# Patient Record
Sex: Female | Born: 1975 | Race: Black or African American | Hispanic: No | Marital: Married | Smoking: Former smoker
Health system: Southern US, Community
[De-identification: ages and names within clinical notes are randomized; demographics above are authoritative.]

## PROBLEM LIST (undated history)

## (undated) DIAGNOSIS — M1990 Unspecified osteoarthritis, unspecified site: Secondary | ICD-10-CM

---

## 1997-12-21 ENCOUNTER — Other Ambulatory Visit: Admission: RE | Admit: 1997-12-21 | Discharge: 1997-12-21 | Payer: Self-pay | Admitting: Obstetrics and Gynecology

## 1998-11-27 ENCOUNTER — Emergency Department (HOSPITAL_COMMUNITY): Admission: EM | Admit: 1998-11-27 | Discharge: 1998-11-27 | Payer: Self-pay

## 1998-11-29 ENCOUNTER — Emergency Department (HOSPITAL_COMMUNITY): Admission: EM | Admit: 1998-11-29 | Discharge: 1998-11-29 | Payer: Self-pay | Admitting: Emergency Medicine

## 1998-12-13 ENCOUNTER — Other Ambulatory Visit: Admission: RE | Admit: 1998-12-13 | Discharge: 1998-12-13 | Payer: Self-pay | Admitting: *Deleted

## 1999-04-20 ENCOUNTER — Ambulatory Visit (HOSPITAL_BASED_OUTPATIENT_CLINIC_OR_DEPARTMENT_OTHER): Admission: RE | Admit: 1999-04-20 | Discharge: 1999-04-20 | Payer: Self-pay | Admitting: Surgery

## 1999-05-08 ENCOUNTER — Other Ambulatory Visit: Admission: RE | Admit: 1999-05-08 | Discharge: 1999-05-08 | Payer: Self-pay | Admitting: Gynecology

## 2000-04-15 ENCOUNTER — Other Ambulatory Visit: Admission: RE | Admit: 2000-04-15 | Discharge: 2000-04-15 | Payer: Self-pay | Admitting: Obstetrics and Gynecology

## 2000-10-31 ENCOUNTER — Ambulatory Visit (HOSPITAL_COMMUNITY): Admission: RE | Admit: 2000-10-31 | Discharge: 2000-10-31 | Payer: Self-pay | Admitting: Obstetrics and Gynecology

## 2001-07-06 ENCOUNTER — Other Ambulatory Visit: Admission: RE | Admit: 2001-07-06 | Discharge: 2001-07-06 | Payer: Self-pay | Admitting: Obstetrics and Gynecology

## 2002-01-12 ENCOUNTER — Inpatient Hospital Stay (HOSPITAL_COMMUNITY): Admission: AD | Admit: 2002-01-12 | Discharge: 2002-01-12 | Payer: Self-pay | Admitting: Obstetrics and Gynecology

## 2002-01-14 ENCOUNTER — Ambulatory Visit (HOSPITAL_COMMUNITY): Admission: RE | Admit: 2002-01-14 | Discharge: 2002-01-14 | Payer: Self-pay | Admitting: Obstetrics and Gynecology

## 2003-12-13 ENCOUNTER — Ambulatory Visit (HOSPITAL_COMMUNITY): Admission: RE | Admit: 2003-12-13 | Discharge: 2003-12-13 | Payer: Self-pay | Admitting: Obstetrics and Gynecology

## 2004-01-12 ENCOUNTER — Other Ambulatory Visit: Admission: RE | Admit: 2004-01-12 | Discharge: 2004-01-12 | Payer: Self-pay | Admitting: Obstetrics and Gynecology

## 2004-04-12 ENCOUNTER — Inpatient Hospital Stay (HOSPITAL_COMMUNITY): Admission: AD | Admit: 2004-04-12 | Discharge: 2004-04-12 | Payer: Self-pay | Admitting: Obstetrics and Gynecology

## 2005-06-17 ENCOUNTER — Other Ambulatory Visit: Admission: RE | Admit: 2005-06-17 | Discharge: 2005-06-17 | Payer: Self-pay | Admitting: Obstetrics and Gynecology

## 2005-08-20 ENCOUNTER — Inpatient Hospital Stay (HOSPITAL_COMMUNITY): Admission: EM | Admit: 2005-08-20 | Discharge: 2005-08-22 | Payer: Self-pay | Admitting: *Deleted

## 2005-09-28 IMAGING — US US OB COMP LESS 14 WK
1 series · 18 of 20 positions shown · non-contrast
Comparison: none

CLINICAL DATA: 7 weeks pregnant with cramping and abdominal pain.
 EARLY OBSTETRICAL ULTRASOUND WITH TRANSVAGINAL:
 Intrauterine gestational sac is present.  Yolk sac and embryo are visible.  By measurements of the crown rump length estimated gestational age is 8 weeks 2 days which does correspond to a gestational age by LMP which is 8 weeks 5 days.  Fetal cardiac activity is measured 167 bpm.  No subchorionic hemorrhage is seen.  Corpus luteum cyst is noted on the right ovary.  Ovaries are otherwise normal.  No free fluid is seen.

[Series 1: us ob comp<14 wk · 18 of 20 slices shown]
[im 1/20]
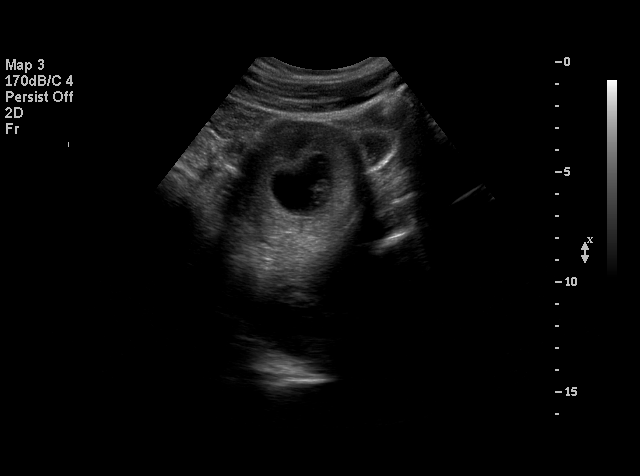
[im 2/20]
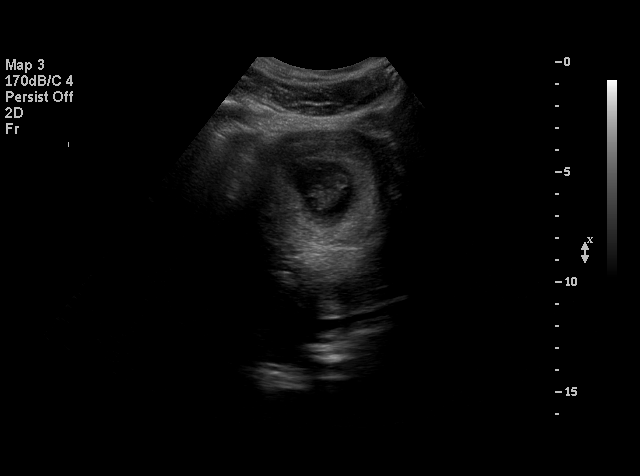
[im 3/20]
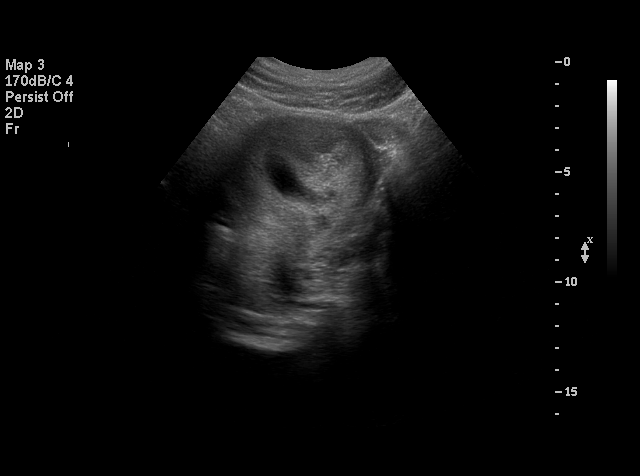
[im 4/20]
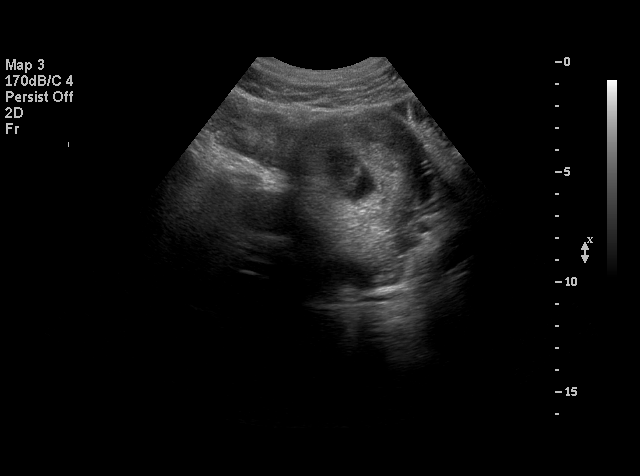
[im 6/20]
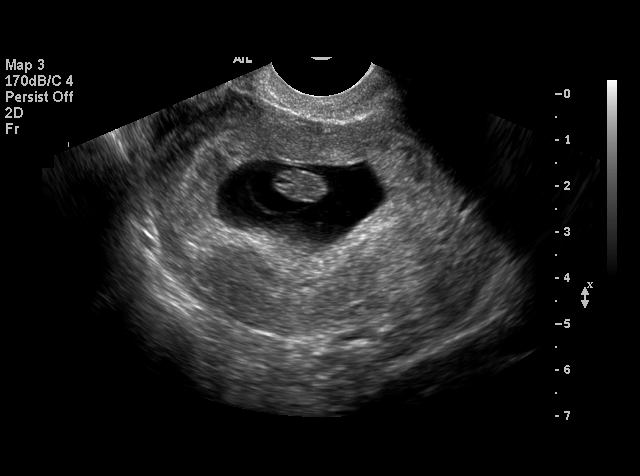
[im 7/20]
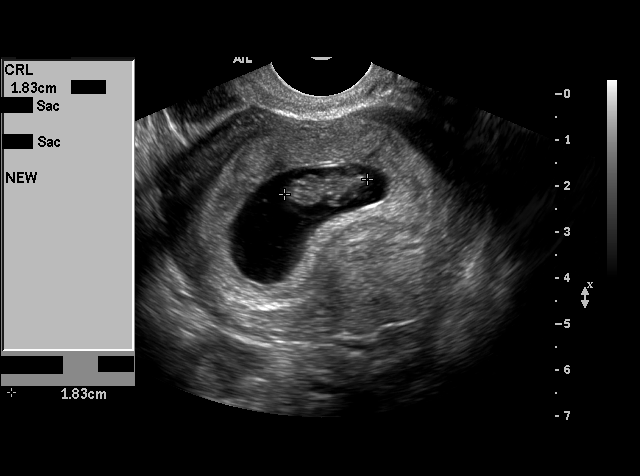
[im 8/20]
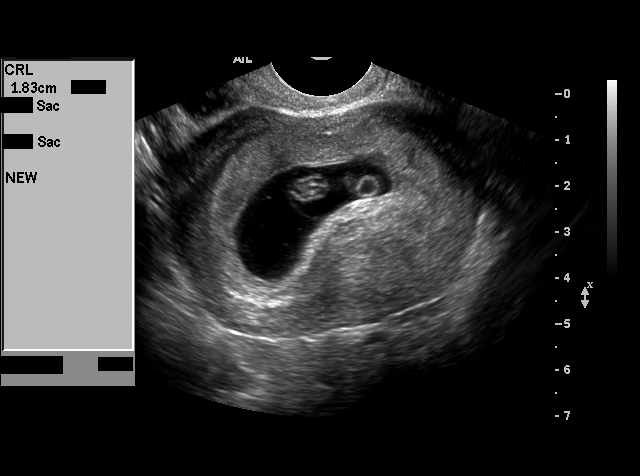
[im 9/20]
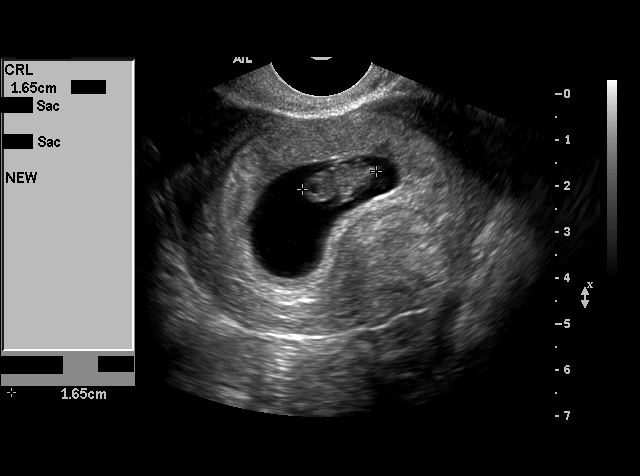
[im 10/20]
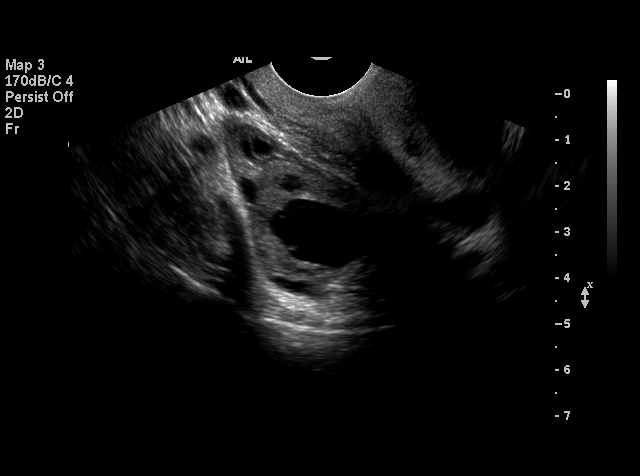
[im 11/20]
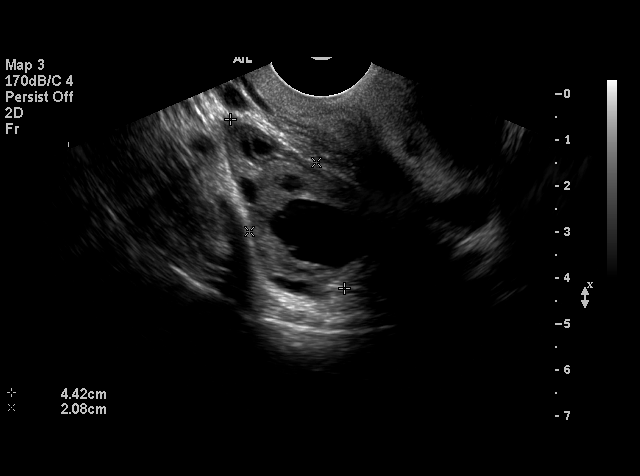
[im 12/20]
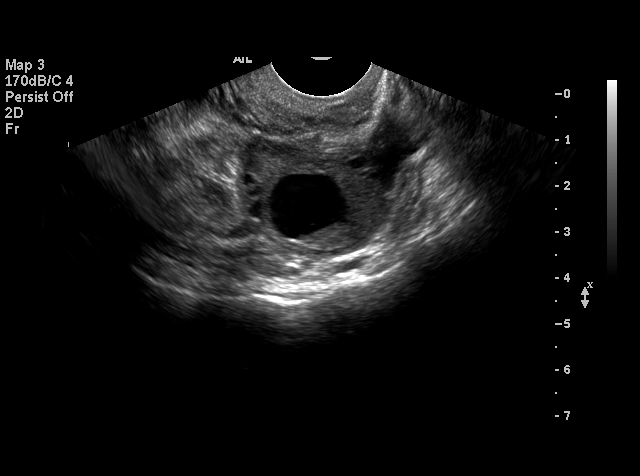
[im 13/20]
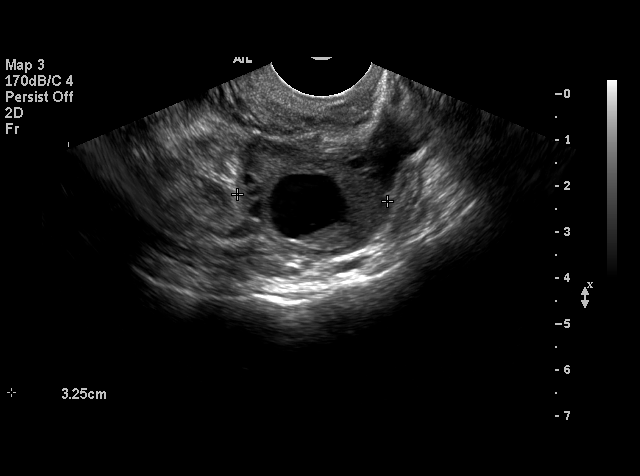
[im 14/20]
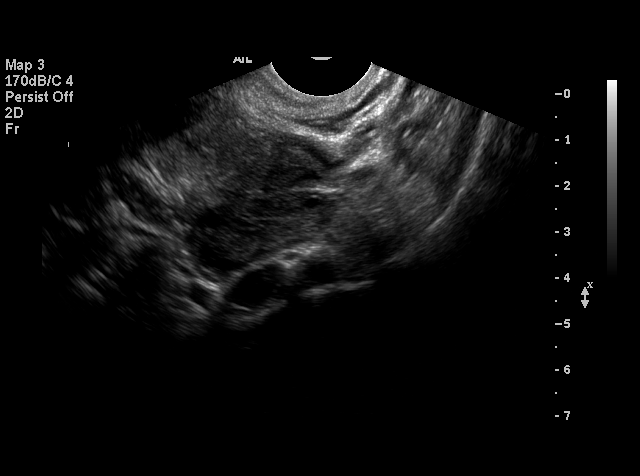
[im 16/20]
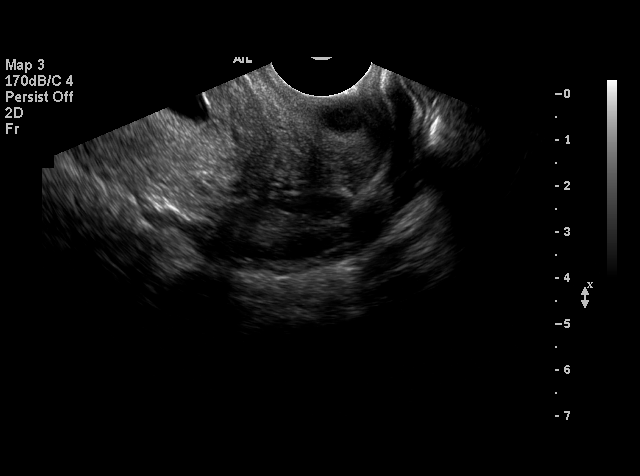
[im 17/20]
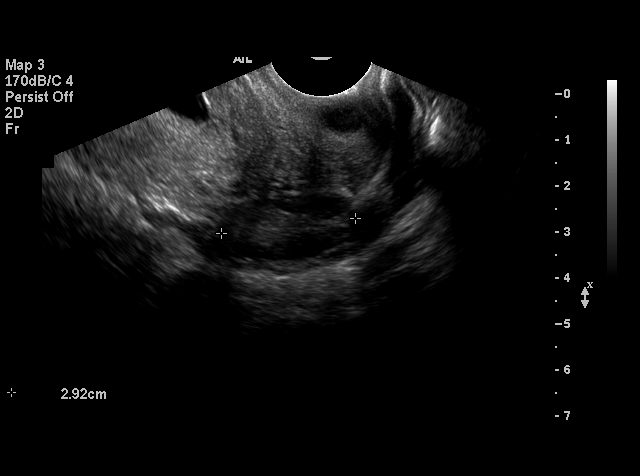
[im 18/20]
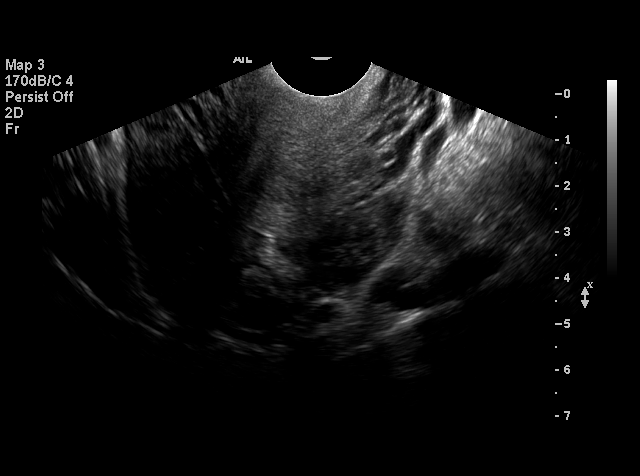
[im 19/20]
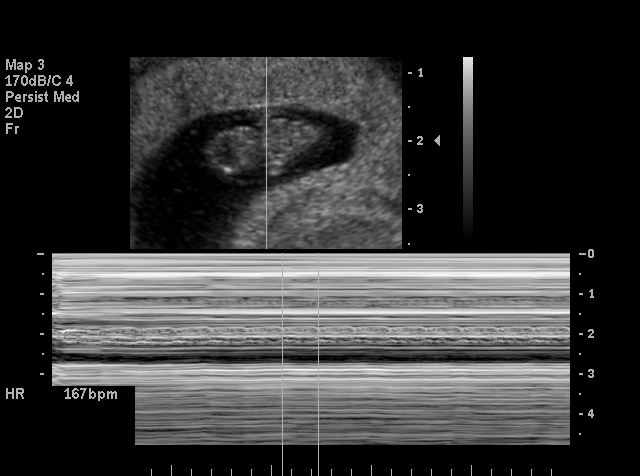
[im 20/20]
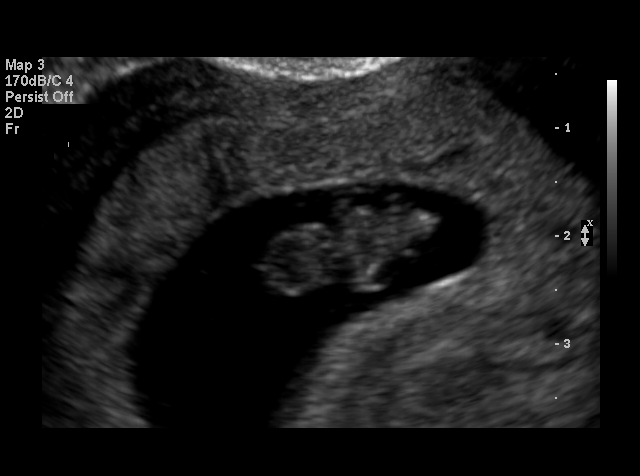

[18 of 20 positions shown; findings below may reference images not displayed]

IMPRESSION: Living intrauterine embryo of 8 weeks 2 days gestation.

## 2006-03-11 HISTORY — PX: CHOLECYSTECTOMY: SHX55

## 2011-03-12 HISTORY — PX: APPENDECTOMY: SHX54

## 2011-12-31 ENCOUNTER — Other Ambulatory Visit: Payer: Self-pay

## 2012-01-09 ENCOUNTER — Ambulatory Visit (INDEPENDENT_AMBULATORY_CARE_PROVIDER_SITE_OTHER): Payer: BC Managed Care – PPO | Admitting: Obstetrics and Gynecology

## 2012-01-09 ENCOUNTER — Encounter: Payer: Self-pay | Admitting: Obstetrics and Gynecology

## 2012-01-09 ENCOUNTER — Other Ambulatory Visit: Payer: Self-pay | Admitting: Obstetrics and Gynecology

## 2012-01-09 VITALS — BP 102/70 | Resp 14 | Ht 66.0 in | Wt 172.0 lb

## 2012-01-09 DIAGNOSIS — Z01419 Encounter for gynecological examination (general) (routine) without abnormal findings: Secondary | ICD-10-CM

## 2012-01-09 DIAGNOSIS — Z124 Encounter for screening for malignant neoplasm of cervix: Secondary | ICD-10-CM

## 2012-01-09 DIAGNOSIS — Z113 Encounter for screening for infections with a predominantly sexual mode of transmission: Secondary | ICD-10-CM

## 2012-01-09 LAB — RPR

## 2012-01-09 MED ORDER — VALACYCLOVIR HCL 500 MG PO TABS: 500.0000 mg | ORAL_TABLET | Freq: Every day | ORAL | Status: AC

## 2012-01-09 NOTE — Progress Notes (Signed)
Patient ID: AAISHA BACKS, female   DOB: 10-19-1975, 36 y.o.   MRN: [id number] Contraception none Last pap 2012 wnl Last Mammo years ago. Pt Mom is Breast cancer survivor Last Colonoscopy 2012 wnl Last Dexa Scan never Primary MD none Abuse at Home none  Pt requests refill of valtrex and referral to PCP secondary to h/o back problems.  Filed Vitals:   01/09/12 1113  BP: 102/70  Resp: 14   ROS: noncontributory  Physical Examination: General appearance - alert, well appearing, and in no distress Neck - supple, no significant adenopathy Chest - clear to auscultation, no wheezes, rales or rhonchi, symmetric air entry Heart - normal rate and regular rhythm Abdomen - soft, nontender, nondistended, no masses or organomegaly Breasts - breasts appear normal, no suspicious masses, no skin or nipple changes or axillary nodes Pelvic - normal external genitalia, vulva, vagina, cervix, uterus and adnexa Back exam - no CVAT Extremities - no edema, redness or tenderness in the calves or thighs  A/P Valtrex qd Std screen with consent Pap today Refer to Dr. Allyne Gee

## 2012-01-10 LAB — PAP IG W/ RFLX HPV ASCU

## 2012-01-10 LAB — HEPATITIS C ANTIBODY: HCV Ab: NEGATIVE

## 2012-01-10 LAB — HEPATITIS B SURFACE ANTIGEN: Hepatitis B Surface Ag: NEGATIVE

## 2012-01-10 LAB — HSV 2 ANTIBODY, IGG: HSV 2 Glycoprotein G Ab, IgG: 3.69 IV — ABNORMAL HIGH

## 2012-01-10 LAB — HSV 1 ANTIBODY, IGG: HSV 1 Glycoprotein G Ab, IgG: 8.74 IV — ABNORMAL HIGH

## 2012-01-10 LAB — HIV ANTIBODY (ROUTINE TESTING W REFLEX): HIV: NONREACTIVE

## 2012-01-15 ENCOUNTER — Telehealth: Payer: Self-pay

## 2012-01-15 NOTE — Telephone Encounter (Signed)
LM for pt to cb for test results. Oakley, Jacqueline A  

## 2012-01-15 NOTE — Telephone Encounter (Signed)
Informed pt of all test results. GC/CT still pending, as they were not run with the pap for some reason. Claire Fisher A

## 2012-01-21 LAB — GC/CHLAMYDIA PROBE AMP, TP VIAL
Chlamydia Probe Amp: NEGATIVE
GC Probe Amp: NEGATIVE

## 2012-04-25 ENCOUNTER — Other Ambulatory Visit: Payer: Self-pay

## 2013-01-14 ENCOUNTER — Other Ambulatory Visit: Payer: Self-pay

## 2013-10-08 ENCOUNTER — Ambulatory Visit (INDEPENDENT_AMBULATORY_CARE_PROVIDER_SITE_OTHER): Payer: BC Managed Care – PPO | Admitting: General Surgery

## 2013-10-19 ENCOUNTER — Ambulatory Visit (INDEPENDENT_AMBULATORY_CARE_PROVIDER_SITE_OTHER): Payer: BC Managed Care – PPO | Admitting: Surgery

## 2014-01-10 ENCOUNTER — Other Ambulatory Visit (INDEPENDENT_AMBULATORY_CARE_PROVIDER_SITE_OTHER): Payer: Self-pay | Admitting: Surgery

## 2014-01-10 NOTE — H&P (Signed)
Claire Fisher  Location: [facility] Patient #: [id number] DOB: 1975-11-23 Single / Language: Claire Fisher / Race: Black or African American Female History of Present Illness [name] MD; 01/10/2014 2:39 PM) Patient words: abdominal mass/lipoma.  The patient is a 38 year old female who presents with an abdominal mass. Patient sent to me from Dr. Everett Graff. Reason for visit: painful abdominal wall nodule. Pleasant active woman. History of laparoscopic appendectomy and cholecystectomy and ovarian cyst removal in the past 10 years. She felt a painful lump around her bellybutton after doing abdominal crunches. This was in June. It persisted. She saw her gynecologist. Surgical consultation recommended. Went to see a surgeron in [place]. He thought probably a hematoma. Recommended heat and nonsteroidals. Return if not improved. The patient notes that its never really gone away. She feels like it is more painful with activity. Feels that when she is twisting. Noticed it after curing her son at a football game. She is back to walking only as more aggressive exercise makes it worse. She thinks it gets larger with activity. Gynecology requested 2nd opinion with our group It is not associated with her menstrual cycle. She is feeling rather sensitive now. Not due to to start menses for another 2 weeks. Heat sometimes helps. Iced sometimes helps. No nausea or vomiting. Having bowel movements every day. No fevers chills or sweats. No for trauma. No history of hypertrophic scarring her keloids. No history of would infections. No history of stitch abscesses. Other Problems Marjean Donna, CMA; 01/10/2014 1:34 PM) Arthritis Back Pain Lump In Breast Migraine Headache  Past Surgical History Marjean Donna, CMA; 01/10/2014 1:34 PM) Appendectomy Breast Biopsy Bilateral. Breast Mass; Local Excision Bilateral. Gallbladder Surgery - Laparoscopic  Diagnostic Studies History Marjean Donna,  CMA; 01/10/2014 1:34 PM) Colonoscopy 1-5 years ago Mammogram >3 years ago Pap Smear 1-5 years ago  Allergies [name], CMA; 01/10/2014 1:35 PM) No Known Drug Allergies 01/10/2014  Medication History ([name], CMA; 01/10/2014 1:35 PM) ValACYclovir HCl (500MG  Tablet, Oral daily) Active. Diclofenac Sodium (75MG  Tablet DR, Oral daily) Active.  Social History ([place]; 01/10/2014 1:34 PM) Alcohol use Moderate alcohol use. Caffeine use Carbonated beverages, Tea. Tobacco use Former smoker.  Family History Marjean Donna, CMA; 01/10/2014 1:34 PM) Alcohol Abuse Father, Sister. Arthritis Mother, Sister. Breast Cancer Mother. Cerebrovascular Accident Father. Diabetes Mellitus Mother, Sister. Heart Disease Father. Hypertension Father, Mother, Sister. Kidney Disease Sister. Migraine Headache Mother. Thyroid problems Mother.  Pregnancy / Birth History Marjean Donna, [place]; 01/10/2014 1:34 PM) Age at menarche 40 years. Gravida 2 Maternal age 69-20 Regular periods     Review of Systems ([place]; 01/10/2014 1:34 PM) General Present- Appetite Loss and Weight Gain. Not Present- Chills, Fatigue, Fever, Night Sweats and Weight Loss. Skin Not Present- Change in Wart/Mole, Dryness, Hives, Jaundice, New Lesions, Non-Healing Wounds, Rash and Ulcer. HEENT Not Present- Earache, Hearing Loss, Hoarseness, Nose Bleed, Oral Ulcers, Ringing in the Ears, Seasonal Allergies, Sinus Pain, Sore Throat, Visual Disturbances, Wears glasses/contact lenses and Yellow Eyes. Respiratory Not Present- Bloody sputum, Chronic Cough, Difficulty Breathing, Snoring and Wheezing. Breast Not Present- Breast Mass, Breast Pain, Nipple Discharge and Skin Changes. Cardiovascular Not Present- Chest Pain, Difficulty Breathing Lying Down, Leg Cramps, Palpitations, Rapid Heart Rate, Shortness of Breath and Swelling of Extremities. Gastrointestinal Present- Abdominal Pain and Gets full quickly at  meals. Not Present- Bloating, Bloody Stool, Change in Bowel Habits, Chronic diarrhea, Constipation, Difficulty Swallowing, Excessive gas, Hemorrhoids, Indigestion, Nausea, Rectal Pain and Vomiting.  Female Genitourinary Not Present- Frequency, Nocturia, Painful Urination, Pelvic Pain and Urgency. Musculoskeletal Present- Back Pain. Not Present- Joint Pain, Joint Stiffness, Muscle Pain, Muscle Weakness and Swelling of Extremities. Neurological Not Present- Decreased Memory, Fainting, Headaches, Numbness, Seizures, Tingling, Tremor, Trouble walking and Weakness. Psychiatric Not Present- Anxiety, Bipolar, Change in Sleep Pattern, Depression, Fearful and Frequent crying. Endocrine Not Present- Cold Intolerance, Excessive Hunger, Hair Changes, Heat Intolerance, Hot flashes and New Diabetes. Hematology Not Present- Easy Bruising, Excessive bleeding, Gland problems, HIV and Persistent Infections.  Vitals ([name] CMA; 01/10/2014 1:36 PM) 01/10/2014 1:35 PM Weight: 170 lb Height: 67in Body Surface Area: 1.91 m Body Mass Index: 26.63 kg/m Temp.: 97.51F(Temporal)  Pulse: 73 (Regular)  BP: 122/70 (Sitting, Left Arm, Standard)     Physical Exam [name] MD; 01/10/2014 2:22 PM)  General Mental Status-Alert. General Appearance-Not in acute distress, Not Sickly. Orientation-Oriented X3. Hydration-Well hydrated. Voice-Normal.  Integumentary Global Assessment Upon inspection and palpation of skin surfaces of the - Axillae: non-tender, no inflammation or ulceration, no drainage. and Distribution of scalp and body hair is normal. General Characteristics Temperature - normal warmth is noted.  Head and Neck Head-normocephalic, atraumatic with no lesions or palpable masses. Face Global Assessment - atraumatic, no absence of expression. Neck Global Assessment - no abnormal movements, no bruit auscultated on the right, no bruit auscultated on the left, no decreased  range of motion, non-tender. Trachea-midline. Thyroid Gland Characteristics - non-tender.  Eye Eyeball - Left-Extraocular movements intact, No Nystagmus. Eyeball - Right-Extraocular movements intact, No Nystagmus. Cornea - Left-No Hazy. Cornea - Right-No Hazy. Sclera/Conjunctiva - Left-No scleral icterus, No Discharge. Sclera/Conjunctiva - Right-No scleral icterus, No Discharge. Pupil - Left-Direct reaction to light normal. Pupil - Right-Direct reaction to light normal.  ENMT Ears Pinna - Left - no drainage observed, no generalized tenderness observed. Right - no drainage observed, no generalized tenderness observed. Nose and Sinuses External Inspection of the Nose - no destructive lesion observed. Inspection of the nares - Left - quiet respiration. Right - quiet respiration. Mouth and Throat Lips - Upper Lip - no fissures observed, no pallor noted. Lower Lip - no fissures observed, no pallor noted. Nasopharynx - no discharge present. Oral Cavity/Oropharynx - Tongue - no dryness observed. Oral Mucosa - no cyanosis observed. Hypopharynx - no evidence of airway distress observed.  Chest and Lung Exam Inspection Movements - Normal and Symmetrical. Accessory muscles - No use of accessory muscles in breathing. Palpation Palpation of the chest reveals - Non-tender. Auscultation Breath sounds - Normal and Clear.  Cardiovascular Auscultation Rhythm - Regular. Murmurs & Other Heart Sounds - Auscultation of the heart reveals - No Murmurs and No Systolic Clicks.  Abdomen Inspection Inspection of the abdomen reveals - No Visible peristalsis and No Abnormal pulsations. Umbilicus - No Bleeding, No Urine drainage. Palpation/Percussion Palpation and Percussion of the abdomen reveal - Soft, Non Tender, No Rebound tenderness, No Rigidity (guarding) and No Cutaneous hyperesthesia.   Female Genitourinary Sexual Maturity Tanner 5 - Adult hair pattern. Note: No vaginal  bleeding nor discharge   Peripheral Vascular Upper Extremity Inspection - Left - No Cyanotic nailbeds, Not Ischemic. Right - No Cyanotic nailbeds, Not Ischemic.  Neurologic Neurologic evaluation reveals -normal attention span and ability to concentrate, able to name objects and repeat phrases. Appropriate fund of knowledge , normal sensation and normal coordination. Mental Status Affect - not angry, not paranoid. Cranial Nerves-Normal Bilaterally. Gait-Normal.  Neuropsychiatric Mental status exam performed with findings of-able to articulate well with normal  speech/language, rate, volume and coherence, thought content normal with ability to perform basic computations and apply abstract reasoning and no evidence of hallucinations, delusions, obsessions or homicidal/suicidal ideation.  Musculoskeletal Global Assessment Spine, Ribs and Pelvis - no instability, subluxation or laxity. Right Upper Extremity - no instability, subluxation or laxity.  Lymphatic Head & Neck  General Head & Neck Lymphatics: Bilateral - Description - No Localized lymphadenopathy. Axillary  General Axillary Region: Bilateral - Description - No Localized lymphadenopathy. Femoral & Inguinal  Generalized Femoral & Inguinal Lymphatics: Left - Description - No Localized lymphadenopathy. Right - Description - No Localized lymphadenopathy.    Assessment & Plan [name] MD; 01/10/2014 2:26 PM)  ABDOMINAL WALL MASS OF PERIUMBILICAL REGION (951.88  R19.05) Impression: Is difficult to determine what these nodules are. I would suspect endometriomas given how firm and fixed to the fascia they are, but they do not correlate with a menstrual cycle R. she had any history of uterine surgery. More likely they are incarcerated hernias.  Because there is no evidence of infection or abscess, reasonable to conthol pain better with anti-inflammatories plus ice/heat. does not seem like a hematoma. Should have  resolved by now. It has been 3 months since her consult by the other surgeon  I think she would benefit from diagnostic laparoscopy to rule out periumbilical incisional hernia. If there are hernias, would reduce and remove the contents and patch the region with mesh since she has recurrent hernias despite stitches.  If that is totally normal, then plan periumbilical abdominal wall exploration with removal of masses. Risks, benefits, alternatives discussed. Questions answered. She agrees to proceed.  Current Plans Schedule for Surgery Pt Education - [facility] Pain Control (Gross) Pt Education - [facility] Laparosopic Post Op HCI (Gross) ABDOMINAL WALL MASS OF RIGHT LOWER QUADRANT (789.33  R19.03)  [name], M.D., F.A.C.S. Gastrointestinal and Minimally Invasive Surgery [facility] [address], [place], [place] [address] [phone] Main / Paging

## 2014-02-23 NOTE — Pre-Procedure Instructions (Signed)
SCARLET ABAD  [birth year]   Your procedure is scheduled on:  Thurs, Dec 31 @ 7:30 AM  Report to [facility] at 5:30 AM.  Call this number if you have problems the morning of surgery: [phone]   Remember:   Do not eat food or drink liquids after midnight.   Take these medicines the morning of surgery with A SIP OF WATER: Valtrex(Valcyclovir)              No Goody's,BC's,Aleve,Aspirin,Ibuprofen,Fish Oil,or any Herbal Medications a week prior to surgery.    Do not wear jewelry, make-up or nail polish.  Do not wear lotions, powders, or perfumes. You may wear deodorant.  Do not shave 48 hours prior to surgery.  Do not bring valuables to the hospital.  [facility] is not responsible                  for any belongings or valuables.               Contacts, dentures or bridgework may not be worn into surgery.  Leave suitcase in the car. After surgery it may be brought to your room.  For patients admitted to the hospital, discharge time is determined by your                treatment team.               Patients discharged the day of surgery will not be allowed to drive  home.    Special Instructions:   - Preparing for Surgery  Before surgery, you can play an important role.  Because skin is not sterile, your skin needs to be as free of germs as possible.  You can reduce the number of germs on you skin by washing with CHG (chlorahexidine gluconate) soap before surgery.  CHG is an antiseptic cleaner which kills germs and bonds with the skin to continue killing germs even after washing.  Please DO NOT use if you have an allergy to CHG or antibacterial soaps.  If your skin becomes reddened/irritated stop using the CHG and inform your nurse when you arrive at Short Stay.  Do not shave (including legs and underarms) for at least 48 hours prior to the first CHG shower.  You may shave your face.  Please follow these instructions carefully:   1.  Shower with CHG Soap  the night before surgery and the                                morning of Surgery.  2.  If you choose to wash your hair, wash your hair first as usual with your       normal shampoo.  3.  After you shampoo, rinse your hair and body thoroughly to remove the                      Shampoo.  4.  Use CHG as you would any other liquid soap.  You can apply chg directly       to the skin and wash gently with scrungie or a clean washcloth.  5.  Apply the CHG Soap to your body ONLY FROM THE NECK DOWN.        Do not use on open wounds or open sores.  Avoid contact with your eyes,       ears, mouth and genitals (  private parts).  Wash genitals (private parts)       with your normal soap.  6.  Wash thoroughly, paying special attention to the area where your surgery        will be performed.  7.  Thoroughly rinse your body with warm water from the neck down.  8.  DO NOT shower/wash with your normal soap after using and rinsing off       the CHG Soap.  9.  Pat yourself dry with a clean towel.            10.  Wear clean pajamas.            11.  Place clean sheets on your bed the night of your first shower and do not        sleep with pets.  Day of Surgery  Do not apply any lotions/deoderants the morning of surgery.  Please wear clean clothes to the hospital/surgery center.     Please read over the following fact sheets that you were given: Pain Booklet, Coughing and Deep Breathing and Surgical Site Infection Prevention

## 2014-02-24 ENCOUNTER — Other Ambulatory Visit (HOSPITAL_COMMUNITY): Payer: Self-pay

## 2014-02-24 ENCOUNTER — Encounter (HOSPITAL_COMMUNITY)
Admission: RE | Admit: 2014-02-24 | Discharge: 2014-02-24 | Disposition: A | Discharge status: Home / Self Care | Payer: BC Managed Care – PPO | Source: Ambulatory Visit | Attending: Surgery | Admitting: Surgery

## 2014-02-24 ENCOUNTER — Encounter (HOSPITAL_COMMUNITY): Payer: Self-pay

## 2014-02-24 DIAGNOSIS — Z01812 Encounter for preprocedural laboratory examination: Secondary | ICD-10-CM | POA: Diagnosis not present

## 2014-02-24 HISTORY — DX: Unspecified osteoarthritis, unspecified site: M19.90

## 2014-02-24 LAB — CBC
HCT: 40.4 % (ref 36.0–46.0)
Hemoglobin: 13.2 g/dL (ref 12.0–15.0)
MCH: 27.8 pg (ref 26.0–34.0)
MCHC: 32.7 g/dL (ref 30.0–36.0)
MCV: 85.1 fL (ref 78.0–100.0)
Platelets: 230 10*3/uL (ref 150–400)
RBC: 4.75 MIL/uL (ref 3.87–5.11)
RDW: 13.9 % (ref 11.5–15.5)
WBC: 7 10*3/uL (ref 4.0–10.5)

## 2014-02-24 LAB — HCG, SERUM, QUALITATIVE: Preg, Serum: NEGATIVE

## 2014-02-24 MED ORDER — CHLORHEXIDINE GLUCONATE 4 % EX LIQD: 1.0000 "application " | Freq: Once | CUTANEOUS | Status: DC

## 2014-03-09 MED ORDER — CHLORHEXIDINE GLUCONATE 4 % EX LIQD
1.0000 "application " | Freq: Once | CUTANEOUS | Status: DC
  Filled 2014-03-09: qty 15

## 2014-03-09 MED ORDER — CEFAZOLIN SODIUM-DEXTROSE 2-3 GM-% IV SOLR
2.0000 g | INTRAVENOUS | Status: AC
  Administered 2014-03-10: 2 g via INTRAVENOUS
  Filled 2014-03-09: qty 50

## 2014-03-10 ENCOUNTER — Encounter (HOSPITAL_COMMUNITY)
Admission: RE | Disposition: A | Discharge status: Home / Self Care | Payer: Self-pay | Source: Ambulatory Visit | Attending: Surgery

## 2014-03-10 ENCOUNTER — Ambulatory Visit (HOSPITAL_COMMUNITY): Payer: BC Managed Care – PPO | Admitting: Certified Registered Nurse Anesthetist

## 2014-03-10 ENCOUNTER — Encounter (HOSPITAL_COMMUNITY): Payer: Self-pay | Admitting: *Deleted

## 2014-03-10 ENCOUNTER — Ambulatory Visit (HOSPITAL_COMMUNITY)
Admission: RE | Admit: 2014-03-10 | Discharge: 2014-03-10 | Disposition: A | Discharge status: Home / Self Care | Payer: BC Managed Care – PPO | Source: Ambulatory Visit | Attending: Surgery | Admitting: Surgery

## 2014-03-10 DIAGNOSIS — G43909 Migraine, unspecified, not intractable, without status migrainosus: Secondary | ICD-10-CM | POA: Insufficient documentation

## 2014-03-10 DIAGNOSIS — D214 Benign neoplasm of connective and other soft tissue of abdomen: Secondary | ICD-10-CM | POA: Insufficient documentation

## 2014-03-10 DIAGNOSIS — Z87891 Personal history of nicotine dependence: Secondary | ICD-10-CM | POA: Insufficient documentation

## 2014-03-10 DIAGNOSIS — R1905 Periumbilic swelling, mass or lump: Secondary | ICD-10-CM | POA: Diagnosis present

## 2014-03-10 DIAGNOSIS — M1990 Unspecified osteoarthritis, unspecified site: Secondary | ICD-10-CM | POA: Diagnosis not present

## 2014-03-10 HISTORY — PX: LAPAROSCOPY: SHX197

## 2014-03-10 SURGERY — LAPAROSCOPY, DIAGNOSTIC
Anesthesia: General | Site: Abdomen

## 2014-03-10 MED ORDER — BUPIVACAINE-EPINEPHRINE 0.25% -1:200000 IJ SOLN
INTRAMUSCULAR | Status: DC | PRN
  Administered 2014-03-10: 20 mL
  Administered 2014-03-10 (×2): 30 mL

## 2014-03-10 MED ORDER — BUPIVACAINE-EPINEPHRINE (PF) 0.25% -1:200000 IJ SOLN
INTRAMUSCULAR | Status: AC
  Filled 2014-03-10: qty 30

## 2014-03-10 MED ORDER — FENTANYL CITRATE 0.05 MG/ML IJ SOLN
25.0000 ug | INTRAMUSCULAR | Status: DC | PRN
  Administered 2014-03-10 (×2): 50 ug via INTRAVENOUS

## 2014-03-10 MED ORDER — 0.9 % SODIUM CHLORIDE (POUR BTL) OPTIME
TOPICAL | Status: DC | PRN
  Administered 2014-03-10 (×2): 1000 mL

## 2014-03-10 MED ORDER — LACTATED RINGERS IV SOLN
INTRAVENOUS | Status: DC | PRN
  Administered 2014-03-10 (×2): via INTRAVENOUS

## 2014-03-10 MED ORDER — DEXAMETHASONE SODIUM PHOSPHATE 4 MG/ML IJ SOLN
INTRAMUSCULAR | Status: AC
  Filled 2014-03-10: qty 1

## 2014-03-10 MED ORDER — KETOROLAC TROMETHAMINE 30 MG/ML IJ SOLN
INTRAMUSCULAR | Status: DC | PRN
  Administered 2014-03-10: 30 mg via INTRAVENOUS

## 2014-03-10 MED ORDER — GLYCOPYRROLATE 0.2 MG/ML IJ SOLN
INTRAMUSCULAR | Status: AC
  Filled 2014-03-10: qty 5

## 2014-03-10 MED ORDER — OXYCODONE HCL 5 MG PO TABS
ORAL_TABLET | ORAL | Status: AC
  Filled 2014-03-10: qty 1

## 2014-03-10 MED ORDER — OXYCODONE HCL 5 MG PO TABS
5.0000 mg | ORAL_TABLET | Freq: Once | ORAL | Status: AC | PRN
  Administered 2014-03-10: 5 mg via ORAL

## 2014-03-10 MED ORDER — NAPROXEN 500 MG PO TABS: 500.0000 mg | ORAL_TABLET | Freq: Two times a day (BID) | ORAL | Status: AC

## 2014-03-10 MED ORDER — ONDANSETRON HCL 4 MG/2ML IJ SOLN
INTRAMUSCULAR | Status: AC
  Filled 2014-03-10: qty 2

## 2014-03-10 MED ORDER — FENTANYL CITRATE 0.05 MG/ML IJ SOLN
INTRAMUSCULAR | Status: AC
  Filled 2014-03-10: qty 2

## 2014-03-10 MED ORDER — ARTIFICIAL TEARS OP OINT
TOPICAL_OINTMENT | OPHTHALMIC | Status: DC | PRN
  Administered 2014-03-10: 1 via OPHTHALMIC

## 2014-03-10 MED ORDER — KETOROLAC TROMETHAMINE 30 MG/ML IJ SOLN
INTRAMUSCULAR | Status: AC
  Filled 2014-03-10: qty 1

## 2014-03-10 MED ORDER — PHENOL 1.4 % MT LIQD
1.0000 | OROMUCOSAL | Status: DC | PRN
  Filled 2014-03-10: qty 177

## 2014-03-10 MED ORDER — NEOSTIGMINE METHYLSULFATE 10 MG/10ML IV SOLN
INTRAVENOUS | Status: AC
  Filled 2014-03-10: qty 1

## 2014-03-10 MED ORDER — PROPOFOL 10 MG/ML IV BOLUS
INTRAVENOUS | Status: AC
  Filled 2014-03-10: qty 20

## 2014-03-10 MED ORDER — MENTHOL 3 MG MT LOZG: 1.0000 | LOZENGE | OROMUCOSAL | Status: DC | PRN

## 2014-03-10 MED ORDER — MIDAZOLAM HCL 2 MG/2ML IJ SOLN
INTRAMUSCULAR | Status: AC
  Filled 2014-03-10: qty 2

## 2014-03-10 MED ORDER — DEXAMETHASONE SODIUM PHOSPHATE 4 MG/ML IJ SOLN
INTRAMUSCULAR | Status: DC | PRN
  Administered 2014-03-10: 4 mg via INTRAVENOUS

## 2014-03-10 MED ORDER — ROCURONIUM BROMIDE 50 MG/5ML IV SOLN
INTRAVENOUS | Status: AC
  Filled 2014-03-10: qty 1

## 2014-03-10 MED ORDER — ONDANSETRON HCL 4 MG/2ML IJ SOLN
INTRAMUSCULAR | Status: DC | PRN
  Administered 2014-03-10: 4 mg via INTRAVENOUS

## 2014-03-10 MED ORDER — GLYCOPYRROLATE 0.2 MG/ML IJ SOLN
INTRAMUSCULAR | Status: DC | PRN
  Administered 2014-03-10: .9 mg via INTRAVENOUS

## 2014-03-10 MED ORDER — LIDOCAINE HCL (CARDIAC) 20 MG/ML IV SOLN
INTRAVENOUS | Status: DC | PRN
  Administered 2014-03-10: 40 mg via INTRAVENOUS

## 2014-03-10 MED ORDER — MENTHOL 3 MG MT LOZG
LOZENGE | OROMUCOSAL | Status: AC
  Filled 2014-03-10: qty 9

## 2014-03-10 MED ORDER — LIDOCAINE HCL (CARDIAC) 20 MG/ML IV SOLN
INTRAVENOUS | Status: AC
  Filled 2014-03-10: qty 5

## 2014-03-10 MED ORDER — MIDAZOLAM HCL 5 MG/5ML IJ SOLN
INTRAMUSCULAR | Status: DC | PRN
  Administered 2014-03-10: 2 mg via INTRAVENOUS

## 2014-03-10 MED ORDER — OXYCODONE HCL 5 MG PO TABS
5.0000 mg | ORAL_TABLET | ORAL | Status: AC | PRN
Start: 2014-03-10 — End: ?

## 2014-03-10 MED ORDER — NEOSTIGMINE METHYLSULFATE 10 MG/10ML IV SOLN
INTRAVENOUS | Status: DC | PRN
  Administered 2014-03-10: 5 mg via INTRAVENOUS

## 2014-03-10 MED ORDER — PROPOFOL 10 MG/ML IV BOLUS
INTRAVENOUS | Status: DC | PRN
  Administered 2014-03-10: 150 mg via INTRAVENOUS

## 2014-03-10 MED ORDER — FENTANYL CITRATE 0.05 MG/ML IJ SOLN
INTRAMUSCULAR | Status: AC
  Filled 2014-03-10: qty 5

## 2014-03-10 MED ORDER — OXYCODONE HCL 5 MG/5ML PO SOLN: 5.0000 mg | Freq: Once | ORAL | Status: AC | PRN

## 2014-03-10 MED ORDER — OXYCODONE HCL 5 MG PO TABS
ORAL_TABLET | ORAL | Status: AC
  Administered 2014-03-10: 5 mg
  Filled 2014-03-10: qty 1

## 2014-03-10 MED ORDER — ROCURONIUM BROMIDE 100 MG/10ML IV SOLN
INTRAVENOUS | Status: DC | PRN
  Administered 2014-03-10: 40 mg via INTRAVENOUS

## 2014-03-10 MED ORDER — ONDANSETRON HCL 4 MG/2ML IJ SOLN: 4.0000 mg | Freq: Once | INTRAMUSCULAR | Status: DC | PRN

## 2014-03-10 MED ORDER — FENTANYL CITRATE 0.05 MG/ML IJ SOLN
INTRAMUSCULAR | Status: DC | PRN
  Administered 2014-03-10: 100 ug via INTRAVENOUS
  Administered 2014-03-10 (×3): 50 ug via INTRAVENOUS

## 2014-03-10 MED ORDER — BUPIVACAINE 0.25 % ON-Q PUMP DUAL CATH 300 ML
300.0000 mL | INJECTION | Status: DC
  Filled 2014-03-10: qty 300

## 2014-03-10 SURGICAL SUPPLY — 51 items
APPLICATOR COTTON TIP 6IN STRL (MISCELLANEOUS) IMPLANT
APPLIER CLIP LOGIC TI 5 (MISCELLANEOUS) IMPLANT
APPLIER CLIP ROT 10 11.4 M/L (STAPLE)
APR CLP MED LRG 11.4X10 (STAPLE)
APR CLP MED LRG 33X5 (MISCELLANEOUS)
CANISTER SUCTION 2500CC (MISCELLANEOUS) IMPLANT
CHLORAPREP W/TINT 26ML (MISCELLANEOUS) ×3 IMPLANT
CLIP APPLIE ROT 10 11.4 M/L (STAPLE) IMPLANT
COVER SURGICAL LIGHT HANDLE (MISCELLANEOUS) ×3 IMPLANT
DEVICE SECURE STRAP 25 ABSORB (INSTRUMENTS) IMPLANT
DRAPE LAPAROSCOPIC ABDOMINAL (DRAPES) ×3 IMPLANT
DRAPE WARM FLUID 44X44 (DRAPE) ×3 IMPLANT
DRSG TEGADERM 2-3/8X2-3/4 SM (GAUZE/BANDAGES/DRESSINGS) ×4 IMPLANT
DRSG TEGADERM 4X4.75 (GAUZE/BANDAGES/DRESSINGS) ×2 IMPLANT
ELECT CAUTERY BLADE 6.4 (BLADE) ×2 IMPLANT
ELECT REM PT RETURN 9FT ADLT (ELECTROSURGICAL) ×3
ELECTRODE REM PT RTRN 9FT ADLT (ELECTROSURGICAL) ×2 IMPLANT
GAUZE SPONGE 2X2 8PLY STRL LF (GAUZE/BANDAGES/DRESSINGS) ×2 IMPLANT
GLOVE BIOGEL PI IND STRL 7.0 (GLOVE) ×2 IMPLANT
GLOVE BIOGEL PI IND STRL 8 (GLOVE) ×3 IMPLANT
GLOVE BIOGEL PI INDICATOR 7.0 (GLOVE) ×2
GLOVE BIOGEL PI INDICATOR 8 (GLOVE) ×2
GLOVE ECLIPSE 8.0 STRL XLNG CF (GLOVE) ×3 IMPLANT
GLOVE SURG SS PI 7.0 STRL IVOR (GLOVE) ×4 IMPLANT
GOWN STRL REUS W/ TWL LRG LVL3 (GOWN DISPOSABLE) ×4 IMPLANT
GOWN STRL REUS W/ TWL XL LVL3 (GOWN DISPOSABLE) ×2 IMPLANT
GOWN STRL REUS W/TWL LRG LVL3 (GOWN DISPOSABLE) ×6
GOWN STRL REUS W/TWL XL LVL3 (GOWN DISPOSABLE) ×3
KIT BASIN OR (CUSTOM PROCEDURE TRAY) ×3 IMPLANT
KIT ROOM TURNOVER OR (KITS) ×3 IMPLANT
NDL SPNL 22GX3.5 QUINCKE BK (NEEDLE) ×1 IMPLANT
NEEDLE 22X1 1/2 (OR ONLY) (NEEDLE) ×3 IMPLANT
NEEDLE SPNL 22GX3.5 QUINCKE BK (NEEDLE) ×3 IMPLANT
NS IRRIG 1000ML POUR BTL (IV SOLUTION) ×6 IMPLANT
PAD ARMBOARD 7.5X6 YLW CONV (MISCELLANEOUS) ×6 IMPLANT
PEN SKIN MARKING BROAD (MISCELLANEOUS) ×3 IMPLANT
PENCIL BUTTON HOLSTER BLD 10FT (ELECTRODE) ×2 IMPLANT
SCALPEL HARMONIC ACE (MISCELLANEOUS) IMPLANT
SCISSORS LAP 5X35 DISP (ENDOMECHANICALS) IMPLANT
SLEEVE ENDOPATH XCEL 5M (ENDOMECHANICALS) ×6 IMPLANT
SPONGE GAUZE 2X2 STER 10/PKG (GAUZE/BANDAGES/DRESSINGS) ×1
STRIP CLOSURE SKIN 1/2X4 (GAUZE/BANDAGES/DRESSINGS) ×3 IMPLANT
SUT MNCRL AB 4-0 PS2 18 (SUTURE) ×5 IMPLANT
SUT PDS AB 1 CT  36 (SUTURE) ×2
SUT PDS AB 1 CT 36 (SUTURE) ×2 IMPLANT
SUT PROLENE 1 CT (SUTURE) ×4 IMPLANT
TOWEL OR 17X24 6PK STRL BLUE (TOWEL DISPOSABLE) ×3 IMPLANT
TOWEL OR 17X26 10 PK STRL BLUE (TOWEL DISPOSABLE) ×3 IMPLANT
TRAY LAPAROSCOPIC (CUSTOM PROCEDURE TRAY) ×3 IMPLANT
TROCAR XCEL NON-BLD 5MMX100MML (ENDOMECHANICALS) ×3 IMPLANT
TUBING INSUFFLATION (TUBING) ×3 IMPLANT

## 2014-03-10 NOTE — Transfer of Care (Signed)
Immediate Anesthesia Transfer of Care Note  Patient: Claire Fisher  Procedure(s) Performed: Procedure(s): LAPAROSCOPY DIAGNOSTIC and EXCISION ABDOMINAL MASS X 2 (N/A)  Patient Location: PACU  Anesthesia Type:General  Level of Consciousness: awake, alert  and oriented  Airway & Oxygen Therapy: Patient Spontanous Breathing and Patient connected to nasal cannula oxygen  Post-op Assessment: Report given to PACU RN and Post -op Vital signs reviewed and stable  Post vital signs: Reviewed and stable  Complications: No apparent anesthesia complications

## 2014-03-10 NOTE — Interval H&P Note (Signed)
History and Physical Interval Note:  13/10/6576 4:69 AM  Lunette Stands  has presented today for surgery, with the diagnosis of Periumbilical Abdominal Wall Masses  The various methods of treatment have been discussed with the patient and family. After consideration of risks, benefits and other options for treatment, the patient has consented to  Procedure(s): LAPAROSCOPY DIAGNOSTIC, POSSIBLE ABDOMINAL WALL EXPLORATION (N/A) POSSIBLE LAPAROSCOPIC VENTRAL HERNIA (N/A) as a surgical intervention .  The patient's history has been reviewed, patient examined, no change in status, stable for surgery.  I have reviewed the patient's chart and labs.  Questions were answered to the patient's satisfaction.     GROSS,STEVEN C.

## 2014-03-10 NOTE — Anesthesia Procedure Notes (Signed)
Procedure Name: Intubation Date/Time: 03/10/2014 7:47 AM Performed by: Ollen Bowl Pre-anesthesia Checklist: Patient identified, Emergency Drugs available, Suction available, Patient being monitored and Timeout performed Patient Re-evaluated:Patient Re-evaluated prior to inductionOxygen Delivery Method: Circle system utilized and Simple face mask Preoxygenation: Pre-oxygenation with 100% oxygen Intubation Type: Combination inhalational/ intravenous induction Ventilation: Mask ventilation without difficulty Laryngoscope Size: Miller and 2 Grade View: Grade I Tube type: Oral Tube size: 7.5 mm Number of attempts: 1 Airway Equipment and Method: Patient positioned with wedge pillow and Stylet Placement Confirmation: ETT inserted through vocal cords under direct vision,  positive ETCO2 and breath sounds checked- equal and bilateral Secured at: 21 cm Tube secured with: Tape Dental Injury: Teeth and Oropharynx as per pre-operative assessment

## 2014-03-10 NOTE — Anesthesia Preprocedure Evaluation (Addendum)
Anesthesia Evaluation  Patient identified by MRN, date of birth, ID band Patient awake    Reviewed: Allergy & Precautions, H&P , NPO status , Patient's Chart, lab work & pertinent test results  Airway Mallampati: II  TM Distance: >3 FB Neck ROM: Full    Dental  (+) Teeth Intact, Dental Advisory Given   Pulmonary former smoker,  breath sounds clear to auscultation        Cardiovascular Rhythm:Regular Rate:Normal     Neuro/Psych    GI/Hepatic   Endo/Other    Renal/GU      Musculoskeletal  (+) Arthritis -,   Abdominal   Peds  Hematology   Anesthesia Other Findings   Reproductive/Obstetrics                            Anesthesia Physical Anesthesia Plan  ASA: II  Anesthesia Plan: General   Post-op Pain Management:    Induction: Intravenous  Airway Management Planned: Oral ETT  Additional Equipment:   Intra-op Plan:   Post-operative Plan: Extubation in OR  Informed Consent: I have reviewed the patients History and Physical, chart, labs and discussed the procedure including the risks, benefits and alternatives for the proposed anesthesia with the patient or authorized representative who has indicated his/her understanding and acceptance.   Dental advisory given  Plan Discussed with: CRNA and Anesthesiologist  Anesthesia Plan Comments:        Anesthesia Quick Evaluation

## 2014-03-10 NOTE — Op Note (Signed)
03/10/2014  9:55 AM  PATIENT:  Claire Fisher  38 y.o. female  Patient Care Team: Pollyann Samples, MD as PCP - General (Family Medicine)  PRE-OPERATIVE DIAGNOSIS:  Periumbilical Abdominal Wall Masses  POST-OPERATIVE DIAGNOSIS:    Periumbilical Abdominal Wall Mass Infraumbilical stitch fistula  PROCEDURE:  Procedure(s): LAPAROSCOPY DIAGNOSTIC EXCISION ABDOMINAL MASS X 2  SURGEON:  Surgeon(s): Michael Boston, MD  ASSISTANT: RN   ANESTHESIA:   local and general  EBL:  Total I/O In: 1400 [I.V.:1400] Out: -   Delay start of Pharmacological VTE agent (>24hrs) due to surgical blood loss or risk of bleeding:  no  DRAINS: none   SPECIMEN:  Source of Specimen:  1.  RLQ SQ ABDOMINAL WALL MASS  2. 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  INDICATION: Pleasant female with painful nodules infraumbilical and right lower quadrant.  No history of endometriosis.  Prior laparoscopic cholecystectomy.  Some question of intermittent drainage at the umbilicus one point.  I recommended diagnostic laparoscopy versus abdominal wall exploration for possible incarcerated hernia versus subcutaneous nodules.  The anatomy & physiology of the abdominal wall was discussed.  The pathophysiology of hernias was discussed.  Natural history risks without surgery including progeressive enlargement, pain, incarceration, & strangulation was discussed.   Contributors to complications such as smoking, obesity, diabetes, prior surgery, etc were discussed.   The pathophysiology of skin & subcutaneous masses was discussed.  Natural history risks without surgery were discussed.  I recommended surgery to remove the mass.  I explained the technique of removal with use of local anesthesia & possible need for more aggressive  sedation/anesthesia for patient comfort.    I feel the risks of no intervention will lead to serious problems that outweigh the operative risks; therefore, I recommended surgery to reduce and repair the hernia.  I explained laparoscopic techniques with possible need for an open approach.  I noted the probable use of mesh to patch and/or buttress the hernia repair  Risks such as bleeding, infection, abscess, need for further treatment, stroke, heart attack, death, and other risks were discussed.  I noted a good likelihood this will help address the problem.   Goals of post-operative recovery were discussed as well.  Possibility that this will not correct all symptoms was explained.  I stressed the importance of low-impact activity, aggressive pain control, avoiding constipation, & not pushing through pain to minimize risk of post-operative chronic pain or injury. Possibility of reherniation especially with smoking, obesity, diabetes, immunosuppression, and other health conditions was discussed.  We will work to minimize complications.     An educational handout further explaining the pathology & treatment options was given as well.  Questions were answered.  The patient expresses understanding & wishes to proceed with surgery.  OR FINDINGS: 2x1cm SQ nodule RLQ abdominal wall.  Patient had small sinus tract through infraumbilical incision.  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  Scarring to and through the fascia into the preperitoneal fat.  All excised.  Fascia closed primarily with absorbable suture  DESCRIPTION:   Informed consent was confirmed.  The patient underwent general anaesthesia without difficulty.  The patient was positioned appropriately.  VTE prevention in place.  The patient's abdomen was clipped, prepped, & draped in a sterile fashion.  Surgical timeout confirmed our plan.  The patient was  positioned in reverse Trendelenburg.  Abdominal entry with a 32mm laparoscopic  port was gained using optical entry technique in the left upper abdomen.  Entry was clean.  I induced carbon dioxide insufflation.  Camera inspection revealed no injury.  Extra ports were carefully placed under direct laparoscopic visualization.  Inspection revealed no hernias.  No adhesions the anterior abdominal wall.  No inguinal hernias.  No diastases.  With her asleep the infra umbilical and right lower quadrant paramedian nodules could be felt through the skin.  Nothing in the peritoneal side.  I therefore stopped laparoscopy.  I made a elliptical transverse incision over the right lower core subcutaneous mass excised in.  I excised a nodular mass down to the fascia but did not have to transect the fascia.  2 x 1 x 1 cm region.  I made a curvilinear incision around the transverse infraumbilical scar.  I excised this fibrous scarred tract and encountered a sinus tract around a green stitch consistent with an Ethibond stitch.  And up having transect around the fascia into the preperitoneum fat until I had much softer tissue.  Resulted in a 25 x 25 mm fascial defect.  Because the patient is healthy, not obese, not a smoker, and the defect was less than 3 cm; I decided to primarily repair this. I repaired it using #1 interrupted Prolene stitches transversely.  I wish to avoid any permanent suture at this time given the probable stitch abscess/sinus.  I did diagnostic laparoscopy and confirmed no injury to intra-abdominal organs.  Closure intact.  I evacuated carbon dioxide.  I removed ports.  I closed all skin areas with 4-0 absorbable absorbable stitch. Sterile dressing was applied. Patient is going to recovery room in stable condition. I anticipate her to leave later today.  I discussed postoperative care with the patient in my office. I discussed in the holding area. Instructions are written. I updated the status of the patient to the patient's family.  I made recommendations.  I answered questions.   Understanding & appreciation was expressed.  [name], M.D., F.A.C.S. Gastrointestinal and Minimally Invasive Surgery [facility] [address], [place], [place] [address] [phone] Main / Paging

## 2014-03-10 NOTE — Discharge Instructions (Signed)
HERNIA REPAIR: POST OP INSTRUCTIONS ° °1. DIET: Follow a light bland diet the first 24 hours after arrival home, such as soup, liquids, crackers, etc.  Be sure to include lots of fluids daily.  Avoid fast food or heavy meals as your are more likely to get nauseated.  Eat a low fat the next few days after surgery. °2. Take your usually prescribed home medications unless otherwise directed. °3. PAIN CONTROL: °a. Pain is best controlled by a usual combination of three different methods TOGETHER: °i. Ice/Heat °ii. Over the counter pain medication °iii. Prescription pain medication °b. Most patients will experience some swelling and bruising around the hernia(s) such as the bellybutton, groins, or old incisions.  Ice packs or heating pads (30-60 minutes up to 6 times a day) will help. Use ice for the first few days to help decrease swelling and bruising, then switch to heat to help relax tight/sore spots and speed recovery.  Some people prefer to use ice alone, heat alone, alternating between ice & heat.  Experiment to what works for you.  Swelling and bruising can take several weeks to resolve.   °c. It is helpful to take an over-the-counter pain medication regularly for the first few weeks.  Choose one of the following that works best for you: °i. Naproxen (Aleve, etc)  Two 220mg tabs twice a day °ii. Ibuprofen (Advil, etc) Three 200mg tabs four times a day (every meal & bedtime) °iii. Acetaminophen (Tylenol, etc) 325-650mg four times a day (every meal & bedtime) °d. A  prescription for pain medication should be given to you upon discharge.  Take your pain medication as prescribed.  °i. If you are having problems/concerns with the prescription medicine (does not control pain, nausea, vomiting, rash, itching, etc), please call us [phone] to see if we need to switch you to a different pain medicine that will work better for you and/or control your side effect better. °ii. If you need a refill on your pain  medication, please contact your pharmacy.  They will contact our office to request authorization. Prescriptions will not be filled after 5 pm or on week-ends. °4. Avoid getting constipated.  Between the surgery and the pain medications, it is common to experience some constipation.  Increasing fluid intake and taking a fiber supplement (such as Metamucil, Citrucel, FiberCon, MiraLax, etc) 1-2 times a day regularly will usually help prevent this problem from occurring.  A mild laxative (prune juice, Milk of Magnesia, MiraLax, etc) should be taken according to package directions if there are no bowel movements after 48 hours.   °5. Wash / shower every day.  You may shower over the dressings as they are waterproof.   °6. Remove your waterproof bandages 5 days after surgery.  You may leave the incision open to air.  You may replace a dressing/Band-Aid to cover the incision for comfort if you wish.  Continue to shower over incision(s) after the dressing is off. ° ° ° °7. ACTIVITIES as tolerated:   °a. You may resume regular (light) daily activities beginning the next day--such as daily self-care, walking, climbing stairs--gradually increasing activities as tolerated.  If you can walk 30 minutes without difficulty, it is safe to try more intense activity such as jogging, treadmill, bicycling, low-impact aerobics, swimming, etc. °b. Save the most intensive and strenuous activity for last such as sit-ups, heavy lifting, contact sports, etc  Refrain from any heavy lifting or straining until you are off narcotics for pain control.   °  c. DO NOT PUSH THROUGH PAIN.  Let pain be your guide: If it hurts to do something, don't do it.  Pain is your body warning you to avoid that activity for another week until the pain goes down. d. You may drive when you are no longer taking prescription pain medication, you can comfortably wear a seatbelt, and you can safely maneuver your car and apply brakes. e. Dennis Bast may have sexual intercourse  when it is comfortable.  8. FOLLOW UP in our office a. Please call [facility] at [phone] to set up an appointment to see your surgeon in the office for a follow-up appointment approximately 2-3 weeks after your surgery. b. Make sure that you call for this appointment the day you arrive home to insure a convenient appointment time. 9.  IF YOU HAVE DISABILITY OR FAMILY LEAVE FORMS, BRING THEM TO THE OFFICE FOR PROCESSING.  DO NOT GIVE THEM TO YOUR DOCTOR.  WHEN TO CALL us [phone]: 1. Poor pain control 2. Reactions / problems with new medications (rash/itching, nausea, etc)  3. Fever over 101.5 F (38.5 C) 4. Inability to urinate 5. Nausea and/or vomiting 6. Worsening swelling or bruising 7. Continued bleeding from incision. 8. Increased pain, redness, or drainage from the incision   The clinic staff is available to answer your questions during regular business hours (8:30am-5pm).  Please dont hesitate to call and ask to speak to one of our nurses for clinical concerns.   If you have a medical emergency, go to the nearest emergency room or call 911.  A surgeon from [facility] is always on call at the hospitals in [facility], [place], [place], [place], [place]  [address] ?  [address] [address], [place], [place]   [address] MAIN: [phone] ? TOLL FREE: [phone] ? FAX: [phone] [email or web address]  Managing Pain  Pain after surgery or related to activity is often due to strain/injury to muscle, tendon, nerves and/or incisions.  This pain is usually short-term and will improve in a few months.   Many people find it helpful to do the following things TOGETHER to help speed the process of healing and to get back to regular activity more quickly:  1. Avoid heavy physical activity a.  no lifting greater than 20 pounds b. Do not push through the pain.  Listen to your body and avoid positions and maneuvers than  reproduce the pain c. Walking is okay as tolerated, but go slowly and stop when getting sore.  d. Remember: If it hurts to do it, then dont do it! 2. Take Anti-inflammatory medication  a. Take with food/snack around the clock for 1-2 weeks i. This helps the muscle and nerve tissues become less irritable and calm down faster b. Choose ONE of the following over-the-counter medications: i. Naproxen 252m tabs (ex. Aleve) 1-2 pills twice a day  ii. Ibuprofen 2072mtabs (ex. Advil, Motrin) 3-4 pills with every meal and just before bedtime iii. Acetaminophen 50062mabs (Tylenol) 1-2 pills with every meal and just before bedtime 3. Use a Heating pad or Ice/Cold Pack a. 4-6 times a day b. May use warm bath/hottub  or showers 4. Try Gentle Massage and/or Stretching  a. at the area of pain many times a day b. stop if you feel pain - do not overdo it  Try these steps together to help you body heal faster and avoid making things get worse.  Doing just one of these  things may not be enough.    If you are not getting better after two weeks or are noticing you are getting worse, contact our office for further advice; we may need to re-evaluate you & see what other things we can do to help.  GETTING TO GOOD BOWEL HEALTH. Irregular bowel habits such as constipation and diarrhea can lead to many problems over time.  Having one soft bowel movement a day is the most important way to prevent further problems.  The anorectal canal is designed to handle stretching and feces to safely manage our ability to get rid of solid waste (feces, poop, stool) out of our body.  BUT, hard constipated stools can act like ripping concrete bricks and diarrhea can be a burning fire to this very sensitive area of our body, causing inflamed hemorrhoids, anal fissures, increasing risk is perirectal abscesses, abdominal pain/bloating, an making irritable bowel worse.     The goal: ONE SOFT BOWEL MOVEMENT A DAY!  To have soft, regular  bowel movements:   Drink at least 8 tall glasses of water a day.    Take plenty of fiber.  Fiber is the undigested part of plant food that passes into the colon, acting s natures broom to encourage bowel motility and movement.  Fiber can absorb and hold large amounts of water. This results in a larger, bulkier stool, which is soft and easier to pass. Work gradually over several weeks up to 6 servings a day of fiber (25g a day even more if needed) in the form of: o Vegetables -- Root (potatoes, carrots, turnips), leafy green (lettuce, salad greens, celery, spinach), or cooked high residue (cabbage, broccoli, etc) o Fruit -- Fresh (unpeeled skin & pulp), Dried (prunes, apricots, cherries, etc ),  or stewed ( applesauce)  o Whole grain breads, pasta, etc (whole wheat)  o Bran cereals   Bulking Agents -- This type of water-retaining fiber generally is easily obtained each day by one of the following:  o Psyllium bran -- The psyllium plant is remarkable because its ground seeds can retain so much water. This product is available as Metamucil, Konsyl, Effersyllium, Per Diem Fiber, or the less expensive generic preparation in drug and health food stores. Although labeled a laxative, it really is not a laxative.  o Methylcellulose -- This is another fiber derived from wood which also retains water. It is available as Citrucel. o Polyethylene Glycol - and artificial fiber commonly called Miralax or Glycolax.  It is helpful for people with gassy or bloated feelings with regular fiber o Flax Seed - a less gassy fiber than psyllium  No reading or other relaxing activity while on the toilet. If bowel movements take longer than 5 minutes, you are too constipated  AVOID CONSTIPATION.  High fiber and water intake usually takes care of this.  Sometimes a laxative is needed to stimulate more frequent bowel movements, but   Laxatives are not a good long-term solution as it can wear the colon out. o Osmotics  (Milk of Magnesia, Fleets phosphosoda, Magnesium citrate, MiraLax, GoLytely) are safer than  o Stimulants (Senokot, Castor Oil, Dulcolax, Ex Lax)    o Do not take laxatives for more than 7days in a row.   IF SEVERELY CONSTIPATED, try a Bowel Retraining Program: o Do not use laxatives.  o Eat a diet high in roughage, such as bran cereals and leafy vegetables.  o Drink six (6) ounces of prune or apricot juice each morning.  o Eat  two (2) large servings of stewed fruit each day.  o Take one (1) heaping tablespoon of a psyllium-based bulking agent twice a day. Use sugar-free sweetener when possible to avoid excessive calories.  o Eat a normal breakfast.  o Set aside 15 minutes after breakfast to sit on the toilet, but do not strain to have a bowel movement.  o If you do not have a bowel movement by the third day, use an enema and repeat the above steps.   Controlling diarrhea o Switch to liquids and simpler foods for a few days to avoid stressing your intestines further. o Avoid dairy products (especially milk & ice cream) for a short time.  The intestines often can lose the ability to digest lactose when stressed. o Avoid foods that cause gassiness or bloating.  Typical foods include beans and other legumes, cabbage, broccoli, and dairy foods.  Every person has some sensitivity to other foods, so listen to our body and avoid those foods that trigger problems for you. o Adding fiber (Citrucel, Metamucil, psyllium, Miralax) gradually can help thicken stools by absorbing excess fluid and retrain the intestines to act more normally.  Slowly increase the dose over a few weeks.  Too much fiber too soon can backfire and cause cramping & bloating. o Probiotics (such as active yogurt, Align, etc) may help repopulate the intestines and colon with normal bacteria and calm down a sensitive digestive tract.  Most studies show it to be of mild help, though, and such products can be costly. o Medicines: - Bismuth  subsalicylate (ex. Kayopectate, Pepto Bismol) every 30 minutes for up to 6 doses can help control diarrhea.  Avoid if pregnant. - Loperamide (Immodium) can slow down diarrhea.  Start with two tablets (4mg  total) first and then try one tablet every 6 hours.  Avoid if you are having fevers or severe pain.  If you are not better or start feeling worse, stop all medicines and call your doctor for advice o Call your doctor if you are getting worse or not better.  Sometimes further testing (cultures, endoscopy, X-ray studies, bloodwork, etc) may be needed to help diagnose and treat the cause of the diarrhea.   General Anesthesia, Adult, Care After  Refer to this sheet in the next few weeks. These instructions provide you with information on caring for yourself after your procedure. Your health care provider may also give you more specific instructions. Your treatment has been planned according to current medical practices, but problems sometimes occur. Call your health care provider if you have any problems or questions after your procedure.  WHAT TO EXPECT AFTER THE PROCEDURE  After the procedure, it is typical to experience:  Sleepiness.  Nausea and vomiting. HOME CARE INSTRUCTIONS  For the first 24 hours after general anesthesia:  Have a responsible person with you.  Do not drive a car. If you are alone, do not take public transportation.  Do not drink alcohol.  Do not take medicine that has not been prescribed by your health care provider.  Do not sign important papers or make important decisions.  You may resume a normal diet and activities as directed by your health care provider.  Change bandages (dressings) as directed.  If you have questions or problems that seem related to general anesthesia, call the hospital and ask for the anesthetist or anesthesiologist on call. SEEK MEDICAL CARE IF:  You have nausea and vomiting that continue the day after anesthesia.  You develop a rash. SEEK  IMMEDIATE MEDICAL CARE IF:  You have difficulty breathing.  You have chest pain.  You have any allergic problems. Document Released: 06/03/2000 Document Revised: 10/28/2012 Document Reviewed: 09/10/2012  [facility] Patient Information 2014 [place], [place].

## 2014-03-10 NOTE — H&P (Signed)
Claire Fisher  Location: [facility] Patient #: [id number] DOB: December 02, 1975 Single / Language: Cleophus Fisher / Race: Black or African American Female  History of Present Illness [name] MD; 01/10/2014 2:39 PM) Patient words: abdominal mass/lipoma.  The patient is a 38 year old female who presents with an abdominal mass. Patient sent to me from Dr. Everett Graff. Reason for visit: painful abdominal wall nodule. Pleasant active woman. History of laparoscopic appendectomy and cholecystectomy and ovarian cyst removal in the past 10 years. She felt a painful lump around her bellybutton after doing abdominal crunches. This was in June. It persisted. She saw her gynecologist. Surgical consultation recommended. Went to see a surgeron in [place]. He thought probably a hematoma. Recommended heat and nonsteroidals. Return if not improved. The patient notes that its never really gone away. She feels like it is more painful with activity. Feels that when she is twisting. Noticed it after curing her son at a football game. She is back to walking only as more aggressive exercise makes it worse. She thinks it gets larger with activity. Gynecology requested 2nd opinion with our group It is not associated with her menstrual cycle. She is feeling rather sensitive now. Not due to to start menses for another 2 weeks. Heat sometimes helps. Iced sometimes helps. No nausea or vomiting. Having bowel movements every day. No fevers chills or sweats. No for trauma. No history of hypertrophic scarring her keloids. No history of would infections. No history of stitch abscesses.   Other Problems Marjean Donna, CMA; 01/10/2014 1:34 PM) Arthritis Back Pain Lump In Breast Migraine Headache  Past Surgical History Marjean Donna, CMA; 01/10/2014 1:34 PM) Appendectomy Breast Biopsy Bilateral. Breast Mass; Local Excision Bilateral. Gallbladder Surgery - Laparoscopic  Diagnostic Studies History Marjean Donna, CMA; 01/10/2014 1:34 PM) Colonoscopy 1-5 years ago Mammogram >3 years ago Pap Smear 1-5 years ago  Allergies [name], CMA; 01/10/2014 1:35 PM) No Known Drug Allergies11/04/2013  Medication History ([name], CMA; 01/10/2014 1:35 PM) ValACYclovir HCl (500MG  Tablet, Oral daily) Active. Diclofenac Sodium (75MG  Tablet DR, Oral daily) Active.  Social History ([place]; 01/10/2014 1:34 PM) Alcohol use Moderate alcohol use. Caffeine use Carbonated beverages, Tea. Tobacco use Former smoker.  Family History Marjean Donna, CMA; 01/10/2014 1:34 PM) Alcohol Abuse Father, Sister. Arthritis Mother, Sister. Breast Cancer Mother. Cerebrovascular Accident Father. Diabetes Mellitus Mother, Sister. Heart Disease Father. Hypertension Father, Mother, Sister. Kidney Disease Sister. Migraine Headache Mother. Thyroid problems Mother.  Pregnancy / Birth History Marjean Donna, [place]; 01/10/2014 1:34 PM) Age at menarche 18 years. Gravida 2 Maternal age 24-20 Regular periods  Review of Systems ([place]; 01/10/2014 1:34 PM) General Present- Appetite Loss and Weight Gain. Not Present- Chills, Fatigue, Fever, Night Sweats and Weight Loss. Skin Not Present- Change in Wart/Mole, Dryness, Hives, Jaundice, New Lesions, Non-Healing Wounds, Rash and Ulcer. HEENT Not Present- Earache, Hearing Loss, Hoarseness, Nose Bleed, Oral Ulcers, Ringing in the Ears, Seasonal Allergies, Sinus Pain, Sore Throat, Visual Disturbances, Wears glasses/contact lenses and Yellow Eyes. Respiratory Not Present- Bloody sputum, Chronic Cough, Difficulty Breathing, Snoring and Wheezing. Breast Not Present- Breast Mass, Breast Pain, Nipple Discharge and Skin Changes. Cardiovascular Not Present- Chest Pain, Difficulty Breathing Lying Down, Leg Cramps, Palpitations, Rapid Heart Rate, Shortness of Breath and Swelling of Extremities. Gastrointestinal Present- Abdominal Pain and Gets full quickly at  meals. Not Present- Bloating, Bloody Stool, Change in Bowel Habits, Chronic diarrhea, Constipation, Difficulty Swallowing, Excessive gas, Hemorrhoids, Indigestion, Nausea, Rectal Pain and Vomiting. Female  Genitourinary Not Present- Frequency, Nocturia, Painful Urination, Pelvic Pain and Urgency. Musculoskeletal Present- Back Pain. Not Present- Joint Pain, Joint Stiffness, Muscle Pain, Muscle Weakness and Swelling of Extremities. Neurological Not Present- Decreased Memory, Fainting, Headaches, Numbness, Seizures, Tingling, Tremor, Trouble walking and Weakness. Psychiatric Not Present- Anxiety, Bipolar, Change in Sleep Pattern, Depression, Fearful and Frequent crying. Endocrine Not Present- Cold Intolerance, Excessive Hunger, Hair Changes, Heat Intolerance, Hot flashes and New Diabetes. Hematology Not Present- Easy Bruising, Excessive bleeding, Gland problems, HIV and Persistent Infections.   Vitals ([name] CMA; 01/10/2014 1:36 PM) 01/10/2014 1:35 PM Weight: 170 lb Height: 67in Body Surface Area: 1.91 m Body Mass Index: 26.63 kg/m Temp.: 97.62F(Temporal)  Pulse: 73 (Regular)  BP: 122/70 (Sitting, Left Arm, Standard)    Physical Exam [name] MD; 01/10/2014 2:22 PM) General Mental Status-Alert. General Appearance-Not in acute distress, Not Sickly. Orientation-Oriented X3. Hydration-Well hydrated. Voice-Normal.  Integumentary Global Assessment Upon inspection and palpation of skin surfaces of the - Axillae: non-tender, no inflammation or ulceration, no drainage. and Distribution of scalp and body hair is normal. General Characteristics Temperature - normal warmth is noted.  Head and Neck Head-normocephalic, atraumatic with no lesions or palpable masses. Face Global Assessment - atraumatic, no absence of expression. Neck Global Assessment - no abnormal movements, no bruit auscultated on the right, no bruit auscultated on the left, no decreased  range of motion, non-tender. Trachea-midline. Thyroid Gland Characteristics - non-tender.  Eye Eyeball - Left-Extraocular movements intact, No Nystagmus. Eyeball - Right-Extraocular movements intact, No Nystagmus. Cornea - Left-No Hazy. Cornea - Right-No Hazy. Sclera/Conjunctiva - Left-No scleral icterus, No Discharge. Sclera/Conjunctiva - Right-No scleral icterus, No Discharge. Pupil - Left-Direct reaction to light normal. Pupil - Right-Direct reaction to light normal.  ENMT Ears Pinna - Left - no drainage observed, no generalized tenderness observed. Right - no drainage observed, no generalized tenderness observed. Nose and Sinuses External Inspection of the Nose - no destructive lesion observed. Inspection of the nares - Left - quiet respiration. Right - quiet respiration. Mouth and Throat Lips - Upper Lip - no fissures observed, no pallor noted. Lower Lip - no fissures observed, no pallor noted. Nasopharynx - no discharge present. Oral Cavity/Oropharynx - Tongue - no dryness observed. Oral Mucosa - no cyanosis observed. Hypopharynx - no evidence of airway distress observed.  Chest and Lung Exam Inspection Movements - Normal and Symmetrical. Accessory muscles - No use of accessory muscles in breathing. Palpation Palpation of the chest reveals - Non-tender. Auscultation Breath sounds - Normal and Clear.  Cardiovascular Auscultation Rhythm - Regular. Murmurs & Other Heart Sounds - Auscultation of the heart reveals - No Murmurs and No Systolic Clicks.  Abdomen Inspection Inspection of the abdomen reveals - No Visible peristalsis and No Abnormal pulsations. Umbilicus - No Bleeding, No Urine drainage. Palpation/Percussion Palpation and Percussion of the abdomen reveal - Soft, Non Tender, No Rebound tenderness, No Rigidity (guarding) and No Cutaneous hyperesthesia.   Female Genitourinary Sexual Maturity Tanner 5 - Adult hair pattern. Note: No vaginal  bleeding nor discharge   Peripheral Vascular Upper Extremity Inspection - Left - No Cyanotic nailbeds, Not Ischemic. Right - No Cyanotic nailbeds, Not Ischemic.  Neurologic Neurologic evaluation reveals -normal attention span and ability to concentrate, able to name objects and repeat phrases. Appropriate fund of knowledge , normal sensation and normal coordination. Mental Status Affect - not angry, not paranoid. Cranial Nerves-Normal Bilaterally. Gait-Normal.  Neuropsychiatric Mental status exam performed with findings of-able to articulate well with normal speech/language, rate,  volume and coherence, thought content normal with ability to perform basic computations and apply abstract reasoning and no evidence of hallucinations, delusions, obsessions or homicidal/suicidal ideation.  Musculoskeletal Global Assessment Spine, Ribs and Pelvis - no instability, subluxation or laxity. Right Upper Extremity - no instability, subluxation or laxity.  Lymphatic Head & Neck  General Head & Neck Lymphatics: Bilateral - Description - No Localized lymphadenopathy. Axillary  General Axillary Region: Bilateral - Description - No Localized lymphadenopathy. Femoral & Inguinal  Generalized Femoral & Inguinal Lymphatics: Left - Description - No Localized lymphadenopathy. Right - Description - No Localized lymphadenopathy.    Assessment & Plan [name] MD; 01/10/2014 2:26 PM) ABDOMINAL WALL MASS OF PERIUMBILICAL REGION (834.19  R19.05)  Impression: Is difficult to determine what these nodules are. I would suspect endometriomas given how firm and fixed to the fascia they are, but they do not correlate with a menstrual cycle nor has she had any history of uterine surgery. More likely they are incarcerated hernias.  Because there is no evidence of infection or abscess, reasonable to conthol pain better with anti-inflammatories plus ice/heat. does not seem like a hematoma. Should have  resolved by now. It has been 3 months since her consult by the other surgeon  I think she would benefit from diagnostic laparoscopy to rule out periumbilical incisional hernia. If there are hernias, would reduce and remove the contents and patch the region with mesh since she has recurrent hernias despite stitches.  If that is totally normal, then plan periumbilical abdominal wall exploration with removal of masses. Risks, benefits, alternatives discussed. Questions answered. She agrees to proceed. Current Plans  Schedule for Surgery Pt Education - [facility] Pain Control (Gross) Pt Education - [facility] Laparosopic Post Op HCI (Gross) ABDOMINAL WALL MASS OF RIGHT LOWER QUADRANT (789.33  R19.03)    [name], M.D., F.A.C.S. Gastrointestinal and Minimally Invasive Surgery [facility] [address], [place], [place] [address] [phone] Main / Paging

## 2014-03-14 ENCOUNTER — Encounter (HOSPITAL_COMMUNITY): Payer: Self-pay | Admitting: Surgery

## 2014-03-18 NOTE — Anesthesia Postprocedure Evaluation (Signed)
  Anesthesia Post-op Note  Patient: Claire Fisher  Procedure(s) Performed: Procedure(s): LAPAROSCOPY DIAGNOSTIC and EXCISION ABDOMINAL MASS X 2 (N/A)  Patient Location: PACU  Anesthesia Type:General  Level of Consciousness: awake, alert  and oriented  Airway and Oxygen Therapy: Patient Spontanous Breathing and Patient connected to nasal cannula oxygen  Post-op Pain: mild  Post-op Assessment: Post-op Vital signs reviewed, Patient's Cardiovascular Status Stable, Respiratory Function Stable and Patent Airway  Post-op Vital Signs: stable  Last Vitals:  Filed Vitals:   03/10/14 1050  BP: 120/87  Pulse: 85  Temp:   Resp: 15    Complications: No apparent anesthesia complications

## 2019-12-24 ENCOUNTER — Other Ambulatory Visit: Payer: Self-pay | Admitting: Obstetrics and Gynecology

## 2019-12-24 DIAGNOSIS — E049 Nontoxic goiter, unspecified: Secondary | ICD-10-CM

## 2020-01-04 ENCOUNTER — Ambulatory Visit
Admission: RE | Admit: 2020-01-04 | Discharge: 2020-01-04 | Disposition: A | Discharge status: Home / Self Care | Payer: BC Managed Care – PPO | Source: Ambulatory Visit | Attending: Obstetrics and Gynecology | Admitting: Obstetrics and Gynecology

## 2020-01-04 DIAGNOSIS — E049 Nontoxic goiter, unspecified: Secondary | ICD-10-CM

## 2020-04-03 ENCOUNTER — Ambulatory Visit: Payer: BC Managed Care – PPO | Admitting: Endocrinology

## 2020-05-08 ENCOUNTER — Encounter: Payer: Self-pay | Admitting: Endocrinology

## 2020-05-08 ENCOUNTER — Other Ambulatory Visit: Payer: Self-pay

## 2020-05-08 ENCOUNTER — Ambulatory Visit (INDEPENDENT_AMBULATORY_CARE_PROVIDER_SITE_OTHER): Payer: BC Managed Care – PPO | Admitting: Endocrinology

## 2020-05-08 DIAGNOSIS — E041 Nontoxic single thyroid nodule: Secondary | ICD-10-CM | POA: Diagnosis not present

## 2020-05-08 NOTE — Progress Notes (Signed)
Subjective:    Patient ID: Claire Fisher, female    DOB: [birth year], 45 y.o.   MRN: [id number]  HPI Pt is referred by Dr Mancel Bale, for nodular thyroid.  Pt was noted to have a thyroid nodule in 2021.  she is unaware of ever having had thyroid problems in the past.  she has no h/o XRT or surgery to the neck.   Past Medical History:  Diagnosis Date  . Arthritis    lower back    Past Surgical History:  Procedure Laterality Date  . APPENDECTOMY  2013  . CHOLECYSTECTOMY  2008  . LAPAROSCOPY N/A 03/10/2014   Procedure: LAPAROSCOPY DIAGNOSTIC and EXCISION ABDOMINAL MASS X 2;  Surgeon: Michael Boston, MD;  Location: [facility];  Service: General;  Laterality: N/A;    Social History   Socioeconomic History  . Marital status: Significant Other    Spouse name: Not on file  . Number of children: Not on file  . Years of education: Not on file  . Highest education level: Not on file  Occupational History  . Not on file  Tobacco Use  . Smoking status: Former Smoker    Packs/day: 1.00    Years: 20.00    Pack years: 20.00    Types: Cigarettes  . Smokeless tobacco: Never Used  Substance and Sexual Activity  . Alcohol use: Yes    Comment: social  . Drug use: No  . Sexual activity: Not on file  Other Topics Concern  . Not on file  Social History Narrative  . Not on file   Social Determinants of Health   Financial Resource Strain: Not on file  Food Insecurity: Not on file  Transportation Needs: Not on file  Physical Activity: Not on file  Stress: Not on file  Social Connections: Not on file  Intimate Partner Violence: Not on file    Current Outpatient Medications on File Prior to Visit  Medication Sig Dispense Refill  . diclofenac (VOLTAREN) 75 MG EC tablet Take 75 mg by mouth daily as needed.  3  . naproxen (NAPROSYN) 500 MG tablet Take 1 tablet (500 mg total) by mouth 2 (two) times daily with a meal. 40 tablet 1  . oxyCODONE (OXY IR/ROXICODONE) 5 MG immediate release tablet  Take 1-2 tablets (5-10 mg total) by mouth every 4 (four) hours as needed for moderate pain, severe pain or breakthrough pain. 40 tablet 0  . valACYclovir (VALTREX) 500 MG tablet Take 1 tablet (500 mg total) by mouth daily. 30 tablet 11   No current facility-administered medications on file prior to visit.    No Known Allergies  Family History  Problem Relation Age of Onset  . Thyroid disease Mother     BP 136/90 (BP Location: Right Arm, Patient Position: Sitting, Cuff Size: Normal)   Pulse 82   Ht 5\' 7"  (1.702 m)   Wt 172 lb (78 kg)   SpO2 99%   BMI 26.94 kg/m   Review of Systems Denies hoarseness, neck pain, sob, diarrhea, and flushing.    Objective:   Physical Exam VITAL SIGNS:  See vs page GENERAL: no distress NECK: There is no palpable thyroid enlargement.  The thyroid nodule is not palpable.  No palpable lymphadenopathy at the anterior neck.   [place]: 1.6 cm thyroid nodule in the right inferior thyroid gland for which fine-needle aspiration is recommended.  TSH=1.2  I have reviewed outside records, and summarized: Pt was seen for wellness visit, and thyroid nodule was  noted.  She was then referred here.      Assessment & Plan:  Thyroid nodule, new to me, uncertain etiology and prognosis.  We discussed.  She declines f/u here.    Patient Instructions  Let's rcheck the biopsy, guided by the ultrasound.  you will receive a phone call, about a day and time for an appointment.   If no cancer is found, you should have the ultrasound rechecked each year for a total of 5 years.  Dr Mancel Bale would be happy to do that for you.

## 2020-05-08 NOTE — Patient Instructions (Signed)
Let's rcheck the biopsy, guided by the ultrasound.  you will receive a phone call, about a day and time for an appointment.   If no cancer is found, you should have the ultrasound rechecked each year for a total of 5 years.  Dr Mancel Bale would be happy to do that for you.

## 2020-05-19 ENCOUNTER — Encounter: Payer: Self-pay | Admitting: Endocrinology

## 2020-06-20 ENCOUNTER — Ambulatory Visit
Admission: RE | Admit: 2020-06-20 | Discharge: 2020-06-20 | Disposition: A | Discharge status: Home / Self Care | Payer: BC Managed Care – PPO | Source: Ambulatory Visit | Attending: Endocrinology | Admitting: Endocrinology

## 2020-06-20 ENCOUNTER — Other Ambulatory Visit (HOSPITAL_COMMUNITY)
Admission: RE | Admit: 2020-06-20 | Discharge: 2020-06-20 | Disposition: A | Discharge status: Home / Self Care | Payer: BC Managed Care – PPO | Source: Ambulatory Visit | Attending: Endocrinology | Admitting: Endocrinology

## 2020-06-20 DIAGNOSIS — E041 Nontoxic single thyroid nodule: Secondary | ICD-10-CM | POA: Diagnosis not present

## 2020-06-21 LAB — CYTOLOGY - NON PAP

## 2021-06-21 IMAGING — US US THYROID
1 series · 13 of 25 positions shown · non-contrast
Comparison: None.

CLINICAL DATA: Goiter

EXAM:
THYROID ULTRASOUND
TECHNIQUE: Ultrasound examination of the thyroid gland and adjacent soft
tissues was performed.

[Series 1: us thyroid · 0.05mm/px · 13 of 43 slices shown]
[im 1/43]
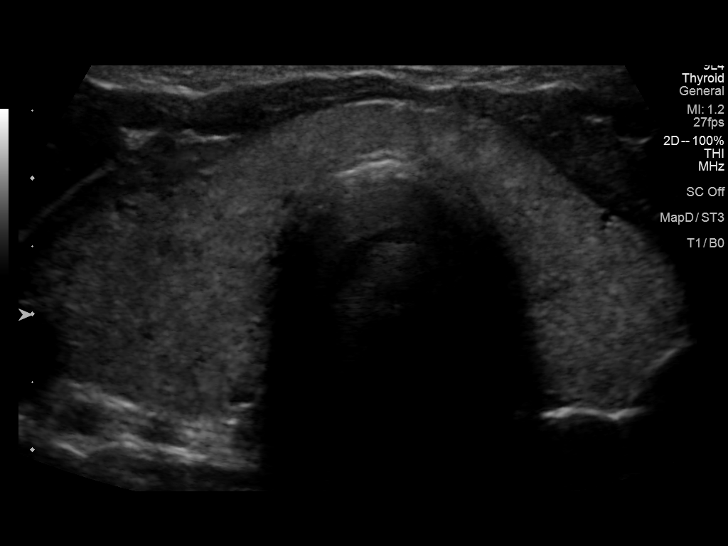
[im 4/43]
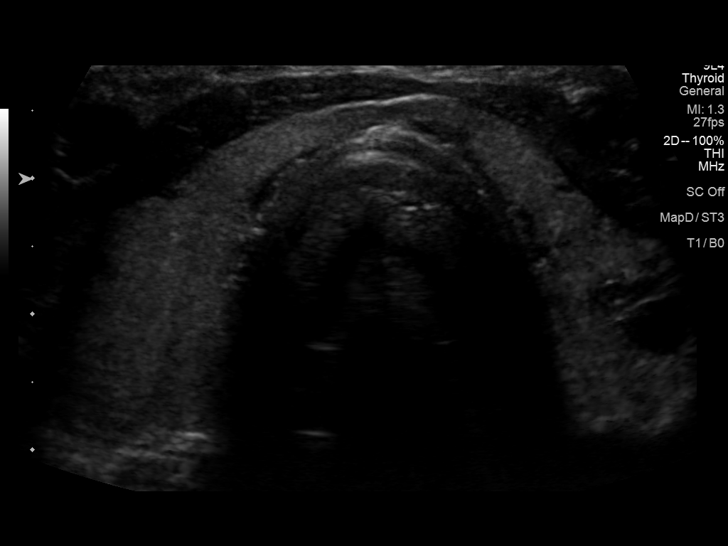
[im 8/43]
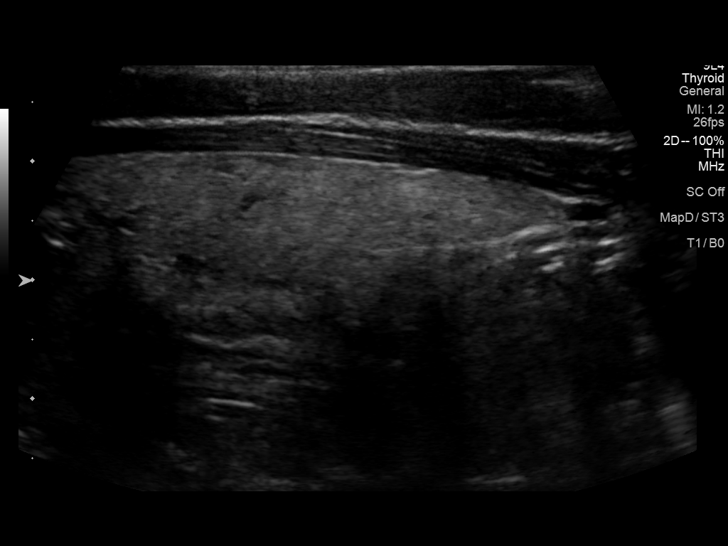
[im 11/43]
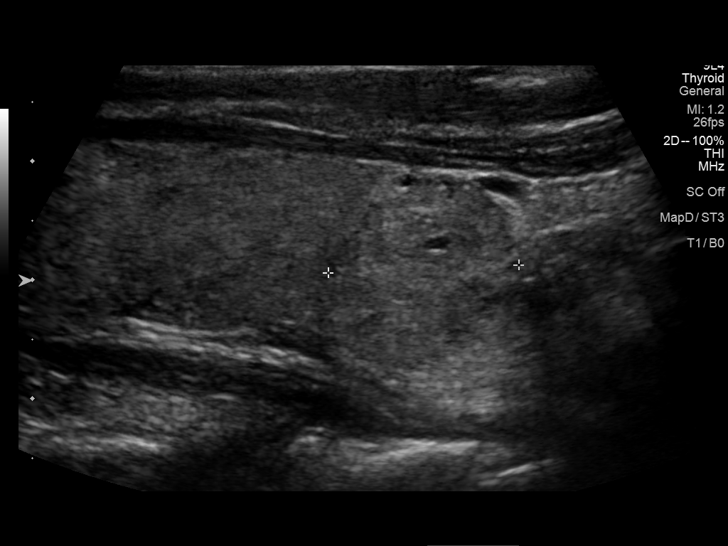
[im 15/43]
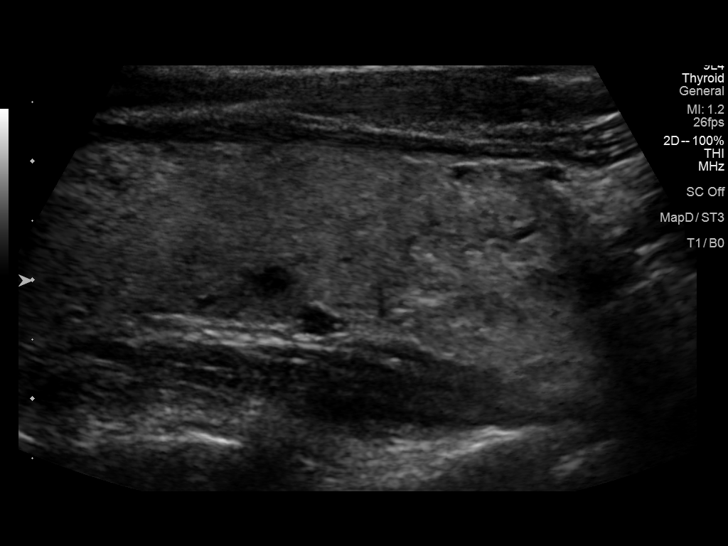
[im 18/43]
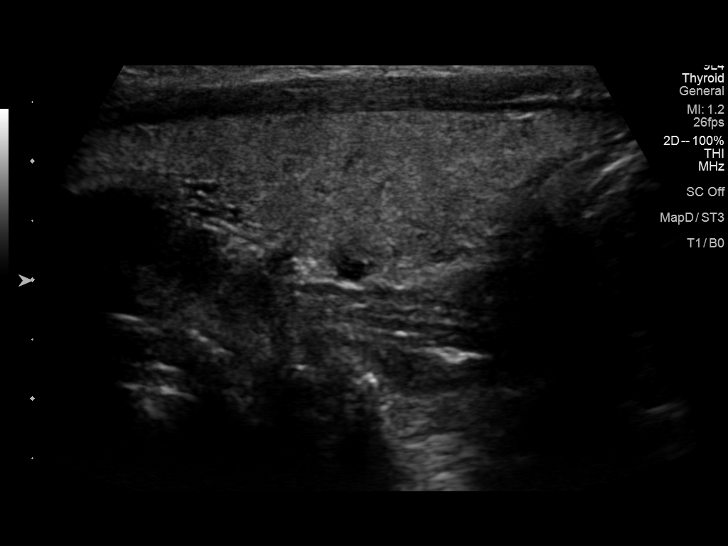
[im 22/43]
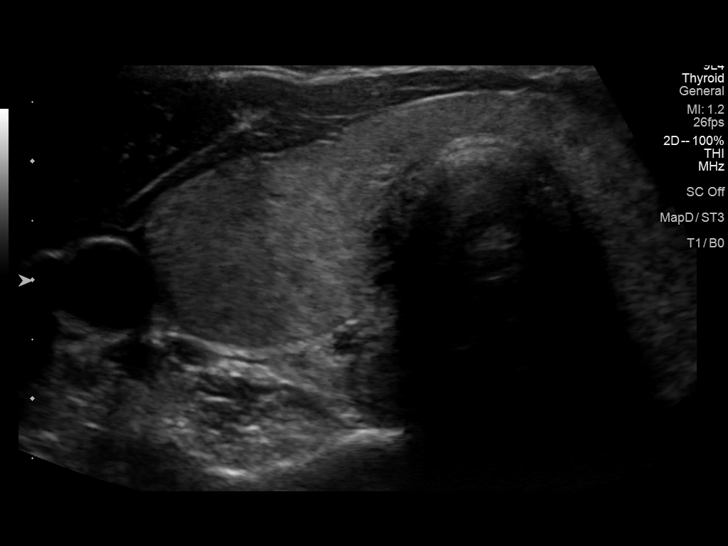
[im 25/43]
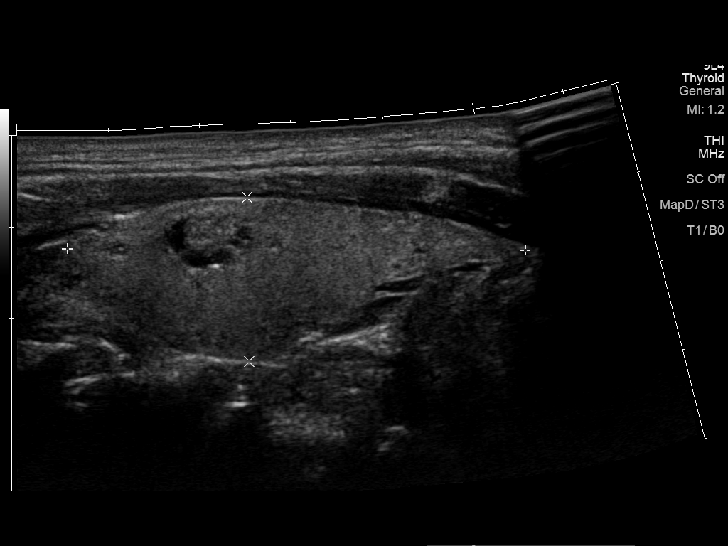
[im 29/43]
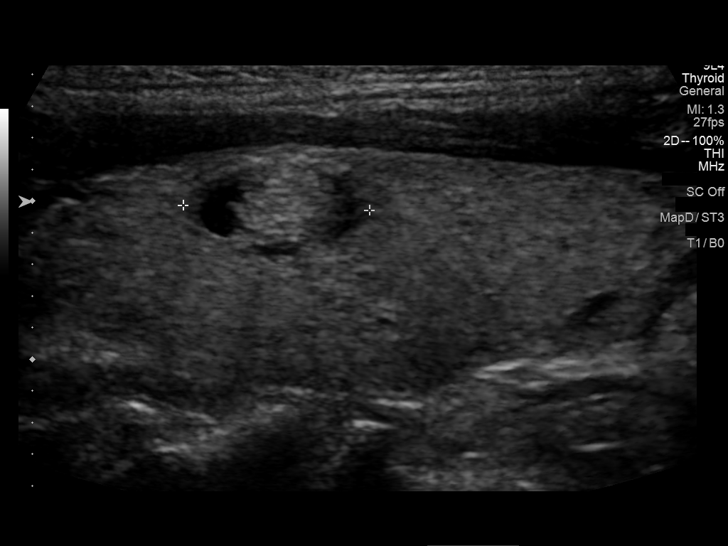
[im 32/43]
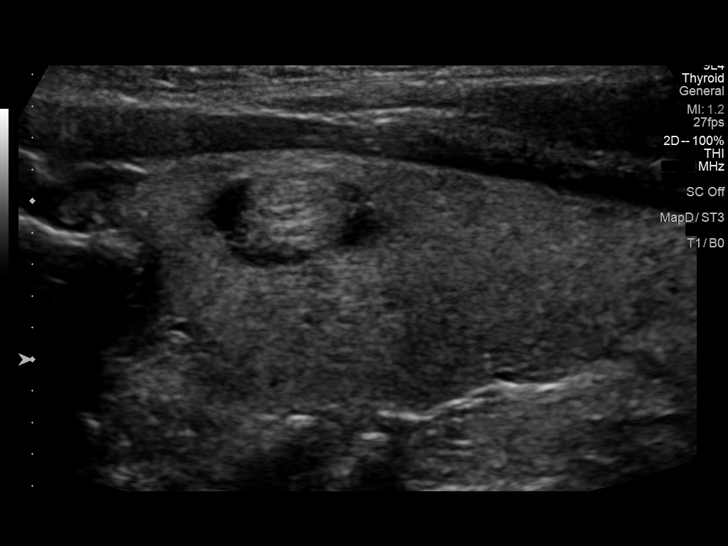
[im 36/43]
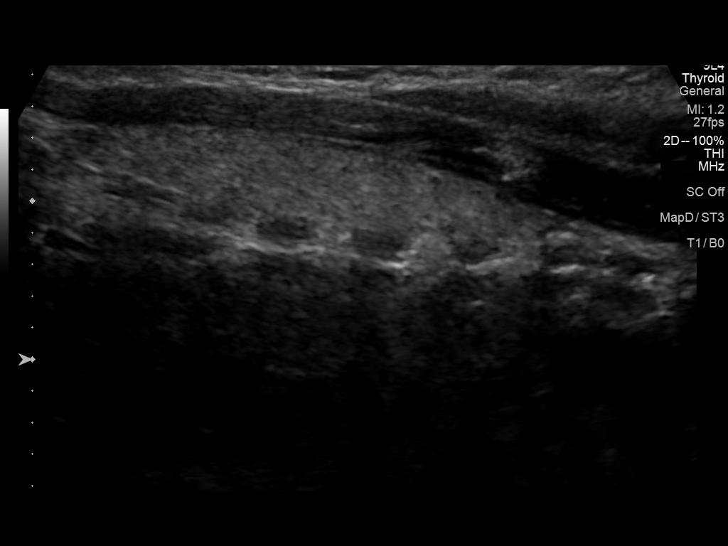
[im 39/43]
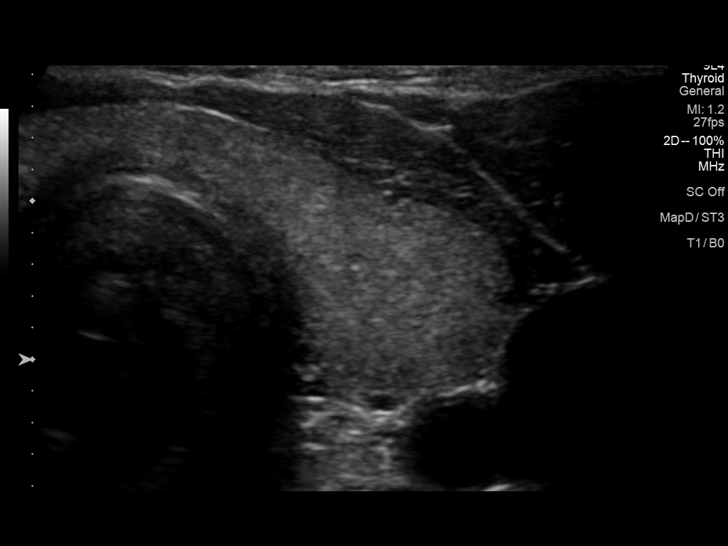
[im 43/43]
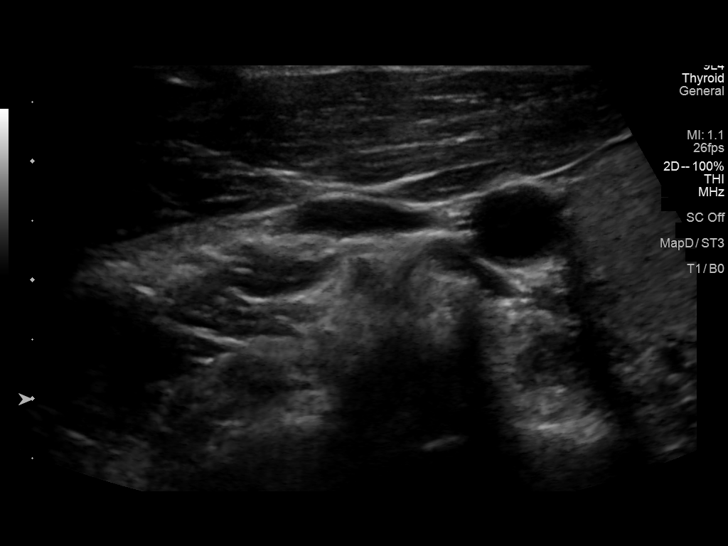

[13 of 25 positions shown; findings below may reference images not displayed]

FINDINGS: Parenchymal Echotexture: Normal

Isthmus: 0.4 cm

Right lobe: 7.3 x 2.1 x 1.9 cm

Left lobe: 5 x 1.8 x 1.4 cm

_________________________________________________________

Estimated total number of nodules >/= 1 cm: 2

Number of spongiform nodules >/=  2 cm not described below (TR1): 0

Number of mixed cystic and solid nodules >/= 1.5 cm not described
below (TR2): 0

_________________________________________________________

Nodule # 1:

Location: Right; Inferior

Maximum size: 1.6 cm; Other 2 dimensions: 1.6 x 1.4 cm

Composition: solid/almost completely solid (2)

Echogenicity: isoechoic (1)

Shape: taller-than-wide (3)

Margins: ill-defined (0)

Echogenic foci: punctate echogenic foci (3)

ACR TI-RADS total points: 9.

ACR TI-RADS risk category: TR5 (>/= 7 points).

ACR TI-RADS recommendations:

**Given size (>/= 1.0 cm) and appearance, fine needle aspiration of
this highly suspicious nodule should be considered based on TI-RADS
criteria.

_________________________________________________________

There is a mixed cystic and solid thyroid nodule in the left
superior thyroid gland measuring 1.2 cm. No further follow-up is
recommended for this thyroid nodule.
IMPRESSION: 1. Mild asymmetric enlargement of the thyroid gland as measured
above.
2. There is a 1.6 cm thyroid nodule in the right inferior thyroid
gland for which fine-needle aspiration is recommended.

The above is in keeping with the ACR TI-RADS recommendations - [HOSPITAL] 0351;[DATE].

## 2021-12-06 IMAGING — US US FNA BIOPSY THYROID 1ST LESION
1 series · 13 of 14 positions shown · non-contrast
Comparison: Ultrasound thyroid dated January 04, 2020

MEDICATIONS:
Lidocaine 1% 2 mL

COMPLICATIONS:
None immediate.

INDICATION: Indeterminate thyroid nodule

EXAM:
ULTRASOUND GUIDED FINE NEEDLE ASPIRATION OF INDETERMINATE THYROID
NODULE
TECHNIQUE: Informed written consent was obtained from the patient after a
discussion of the risks, benefits and alternatives to treatment.
Questions regarding the procedure were encouraged and answered. A
timeout was performed prior to the initiation of the procedure.

[Series 1: us fna biopsy thyroid 1st lesion · 0.06mm/px · 14 acquisitions, 13 frames shown]
[im 1/14]
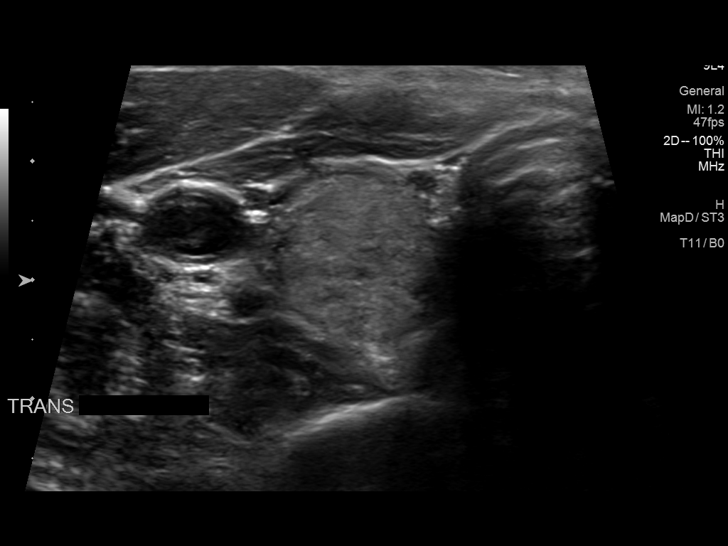
[im 2/14]
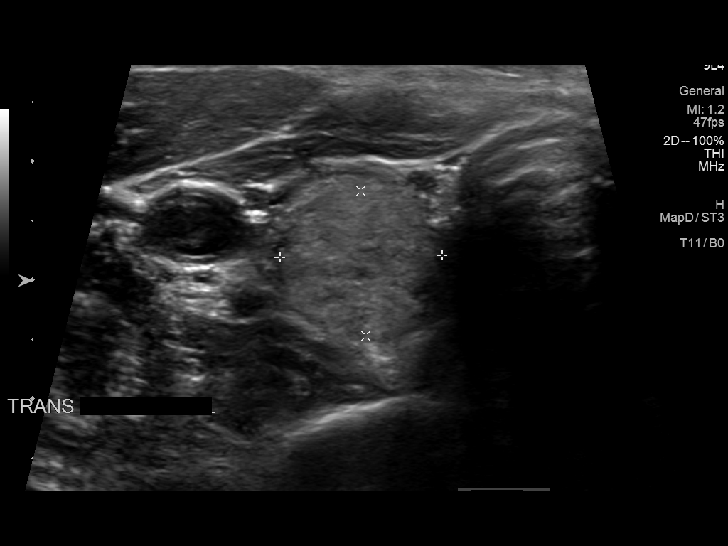
[im 3/14]
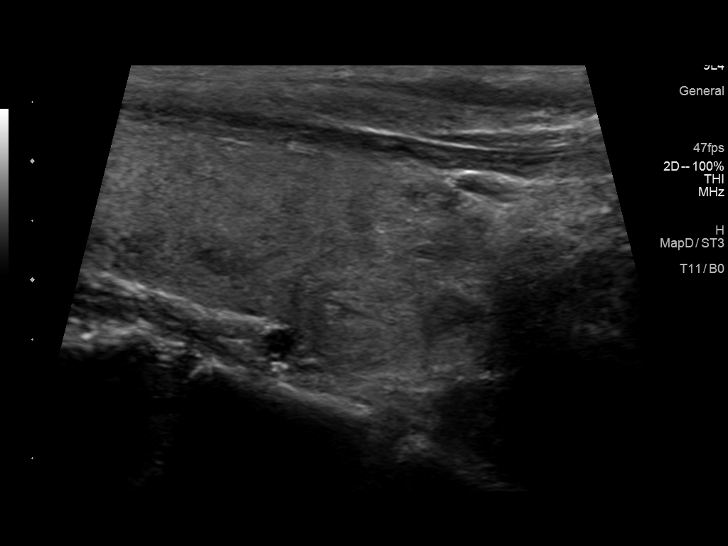
[im 4/14]
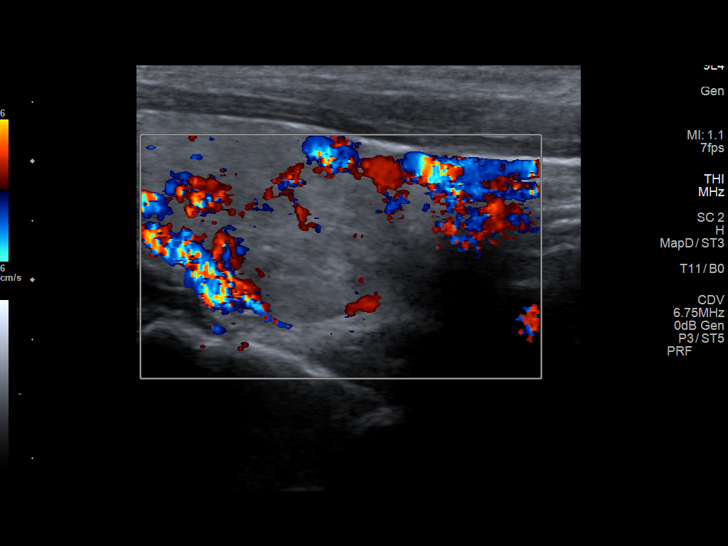
[im 5/14]
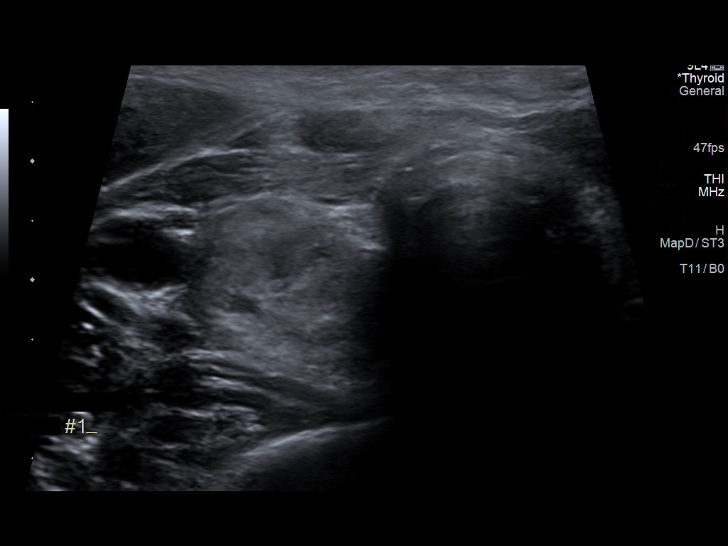
[im 6/14]
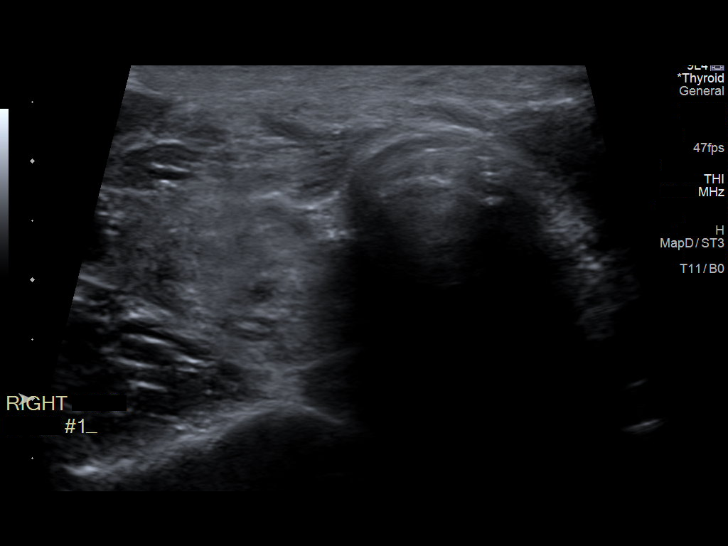
[im 8/14]
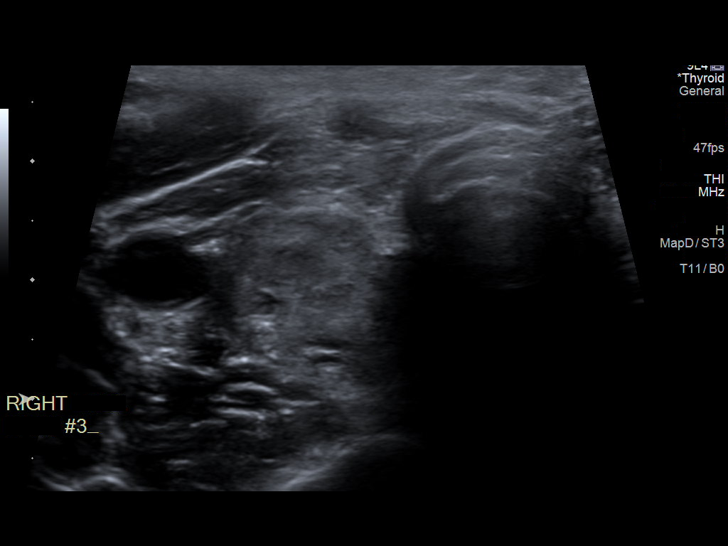
[im 9/14]
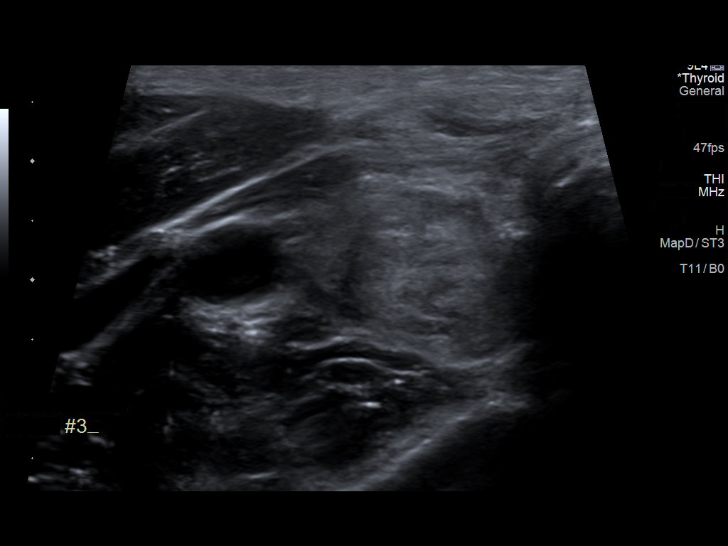
[im 10/14]
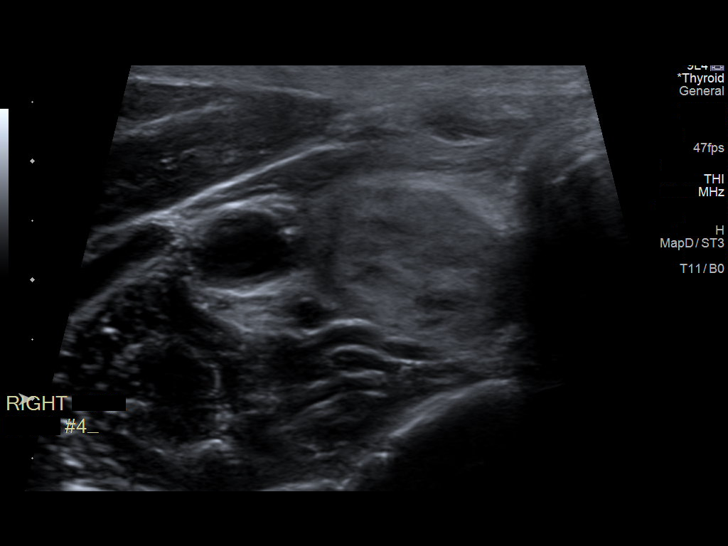
[im 11/14]
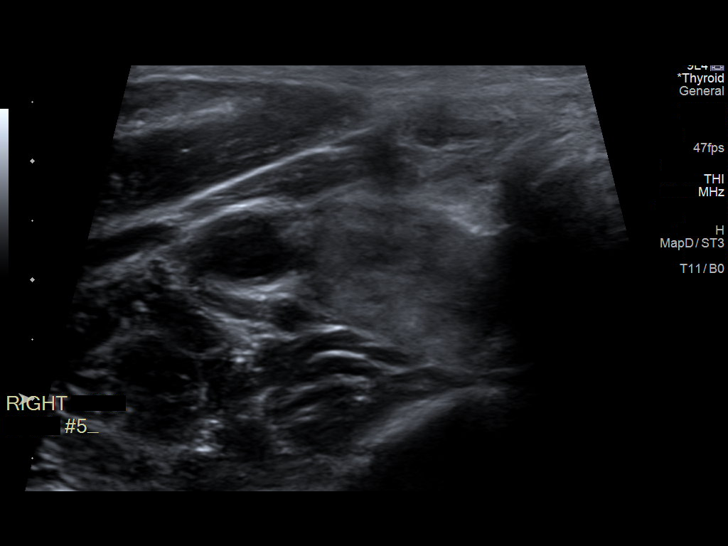
[im 12/14]
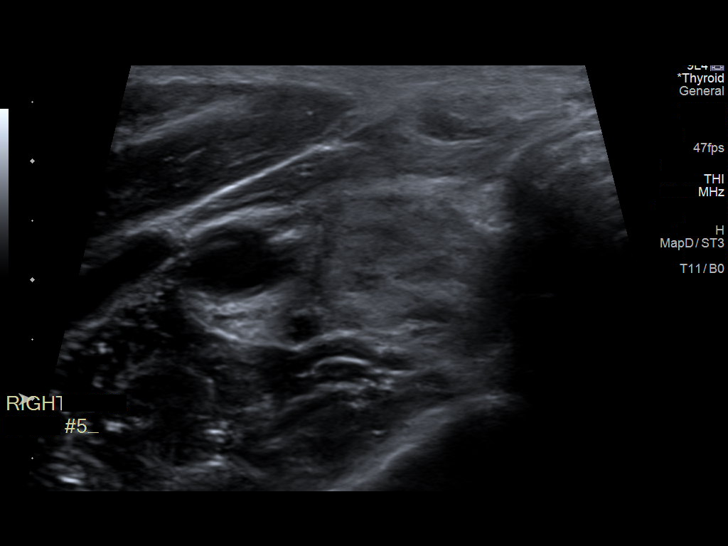
[im 13/14]
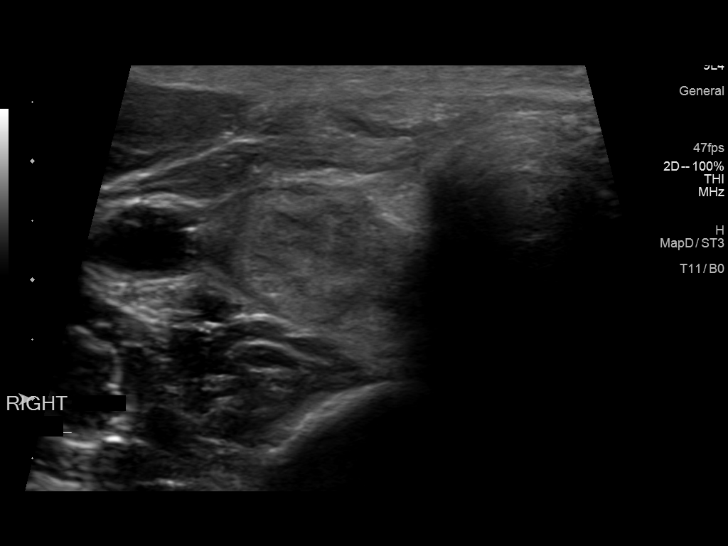
[im 14/14]
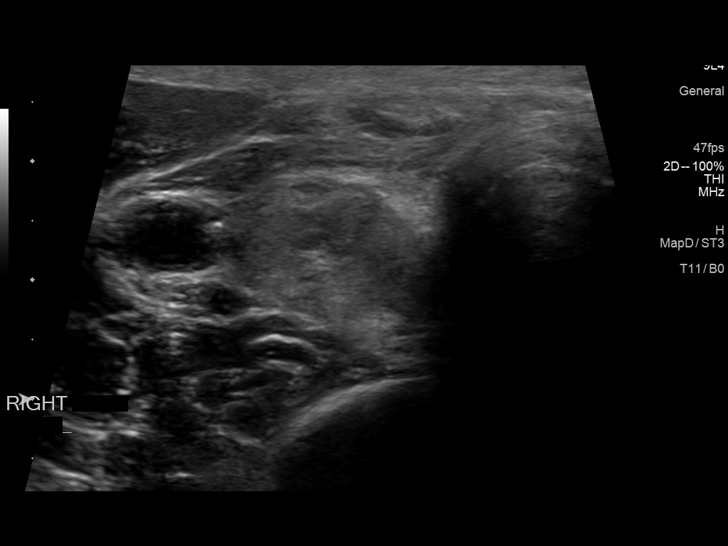

[13 of 14 positions shown; findings below may reference images not displayed]

Pre-procedural ultrasound scanning demonstrated unchanged size and
appearance of the indeterminate nodule within the inferior aspect of
the right thyroid

The procedure was planned. The neck was prepped in the usual sterile
fashion, and a sterile drape was applied covering the operative
field. A timeout was performed prior to the initiation of the
procedure. Local anesthesia was provided with 1% lidocaine.

Under direct ultrasound guidance, 5 FNA biopsies were performed of
the nodule located in the right inferior with a 25 gauge needle.

Two samples were sent to [facility] per ordering [facility].

Multiple ultrasound images were saved for procedural documentation
purposes. The samples were prepared and submitted to pathology.

Limited post procedural scanning was negative for hematoma or
additional complication. Dressings were placed. The patient
tolerated the above procedures procedure well without immediate
postprocedural complication.
FINDINGS: Nodule reference number based on prior diagnostic ultrasound: 1

Maximum size: 1.6 cm

Location: Right; Inferior

ACR TI-RADS risk category: TR5 (>/= 7 points)

Reason for biopsy: meets ACR TI-RADS criteria

Ultrasound imaging confirms appropriate placement of the needles
within the thyroid nodule.
IMPRESSION: Technically successful ultrasound guided fine needle aspiration of
inferior aspect of the right thyroid.

Read by: Frank-Leander Severjanen, NP

## 2022-06-28 ENCOUNTER — Other Ambulatory Visit: Payer: Self-pay | Admitting: Obstetrics and Gynecology

## 2022-06-28 DIAGNOSIS — N6002 Solitary cyst of left breast: Secondary | ICD-10-CM

## 2022-07-01 ENCOUNTER — Other Ambulatory Visit: Payer: Self-pay | Admitting: Obstetrics and Gynecology

## 2022-07-01 DIAGNOSIS — E041 Nontoxic single thyroid nodule: Secondary | ICD-10-CM
